# Patient Record
Sex: Male | Born: 1970 | Race: Black or African American | Hispanic: No | Marital: Single | Smoking: Never smoker
Health system: Southern US, Community
[De-identification: ages and names within clinical notes are randomized; demographics above are authoritative.]

## PROBLEM LIST (undated history)

## (undated) DIAGNOSIS — E119 Type 2 diabetes mellitus without complications: Secondary | ICD-10-CM

## (undated) HISTORY — PX: KNEE SURGERY: SHX244

---

## 2010-11-30 ENCOUNTER — Ambulatory Visit: Payer: Self-pay | Admitting: General Practice

## 2012-04-07 IMAGING — CR RIGHT FOOT COMPLETE - 3+ VIEW
1 series · 3 of 3 positions shown · non-contrast
Comparison: none

REASON FOR EXAM: Pain fax report to [phone]
COMMENTS:

PROCEDURE:     DXR - DXR FOOT RT COMPLETE W/OBLIQUES  - November 30, 2010 [DATE]
RESULT:     No fracture, dislocation or other acute bony abnormality is
identified. No periosteal reactive changes are seen. No radiodense soft
tissue foreign body is observed.

[Series 1: view not recorded · 0.17mm/px · 3 of 3 slices shown]
[im 1/3]
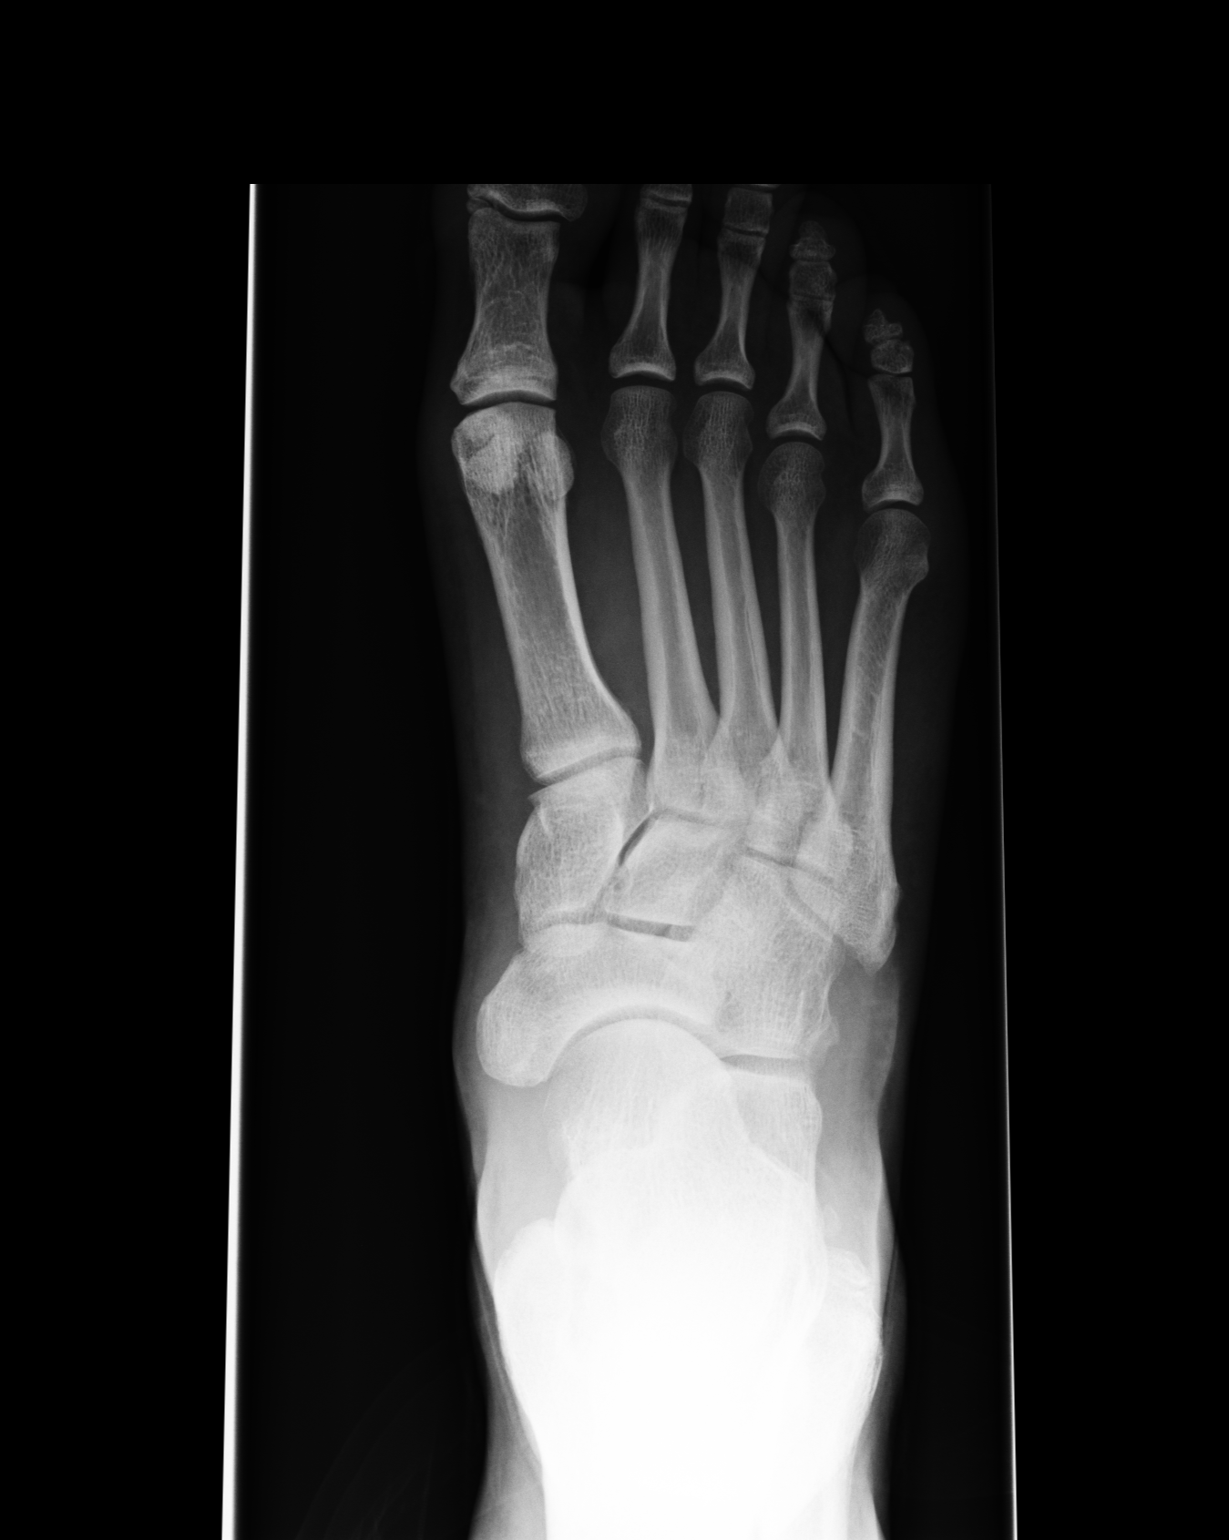
[im 2/3]
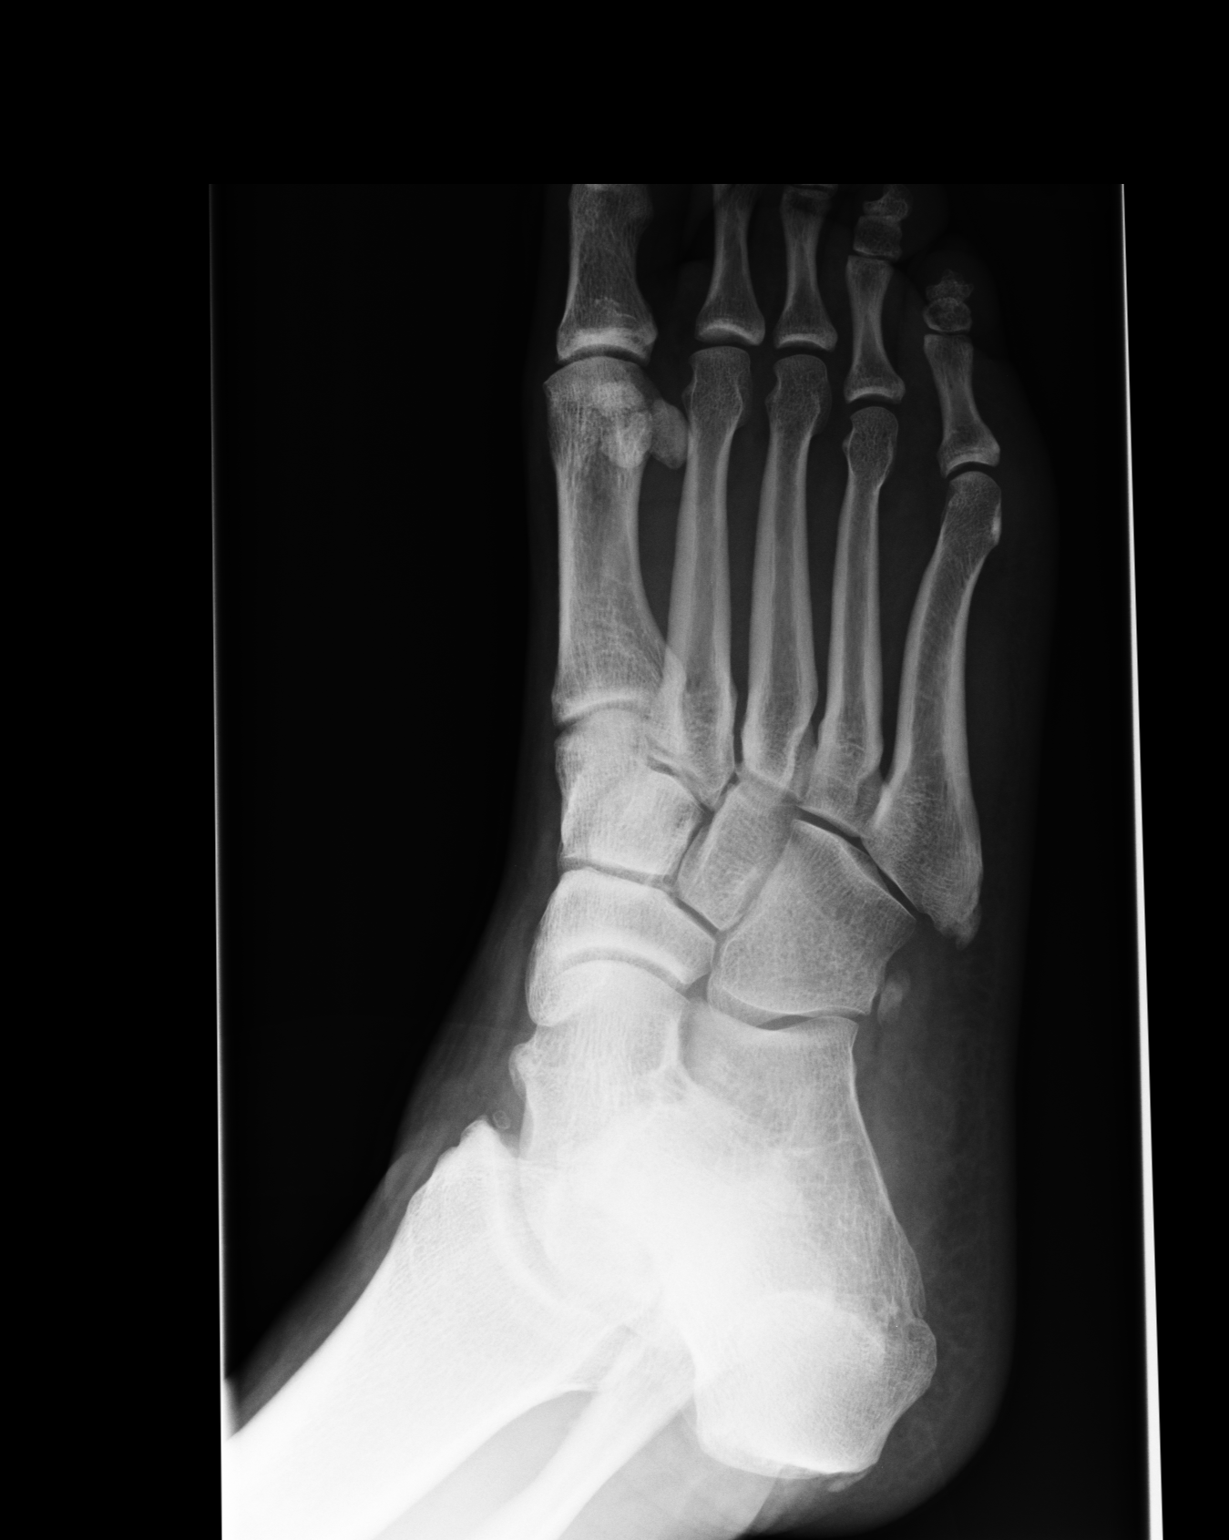
[im 3/3]
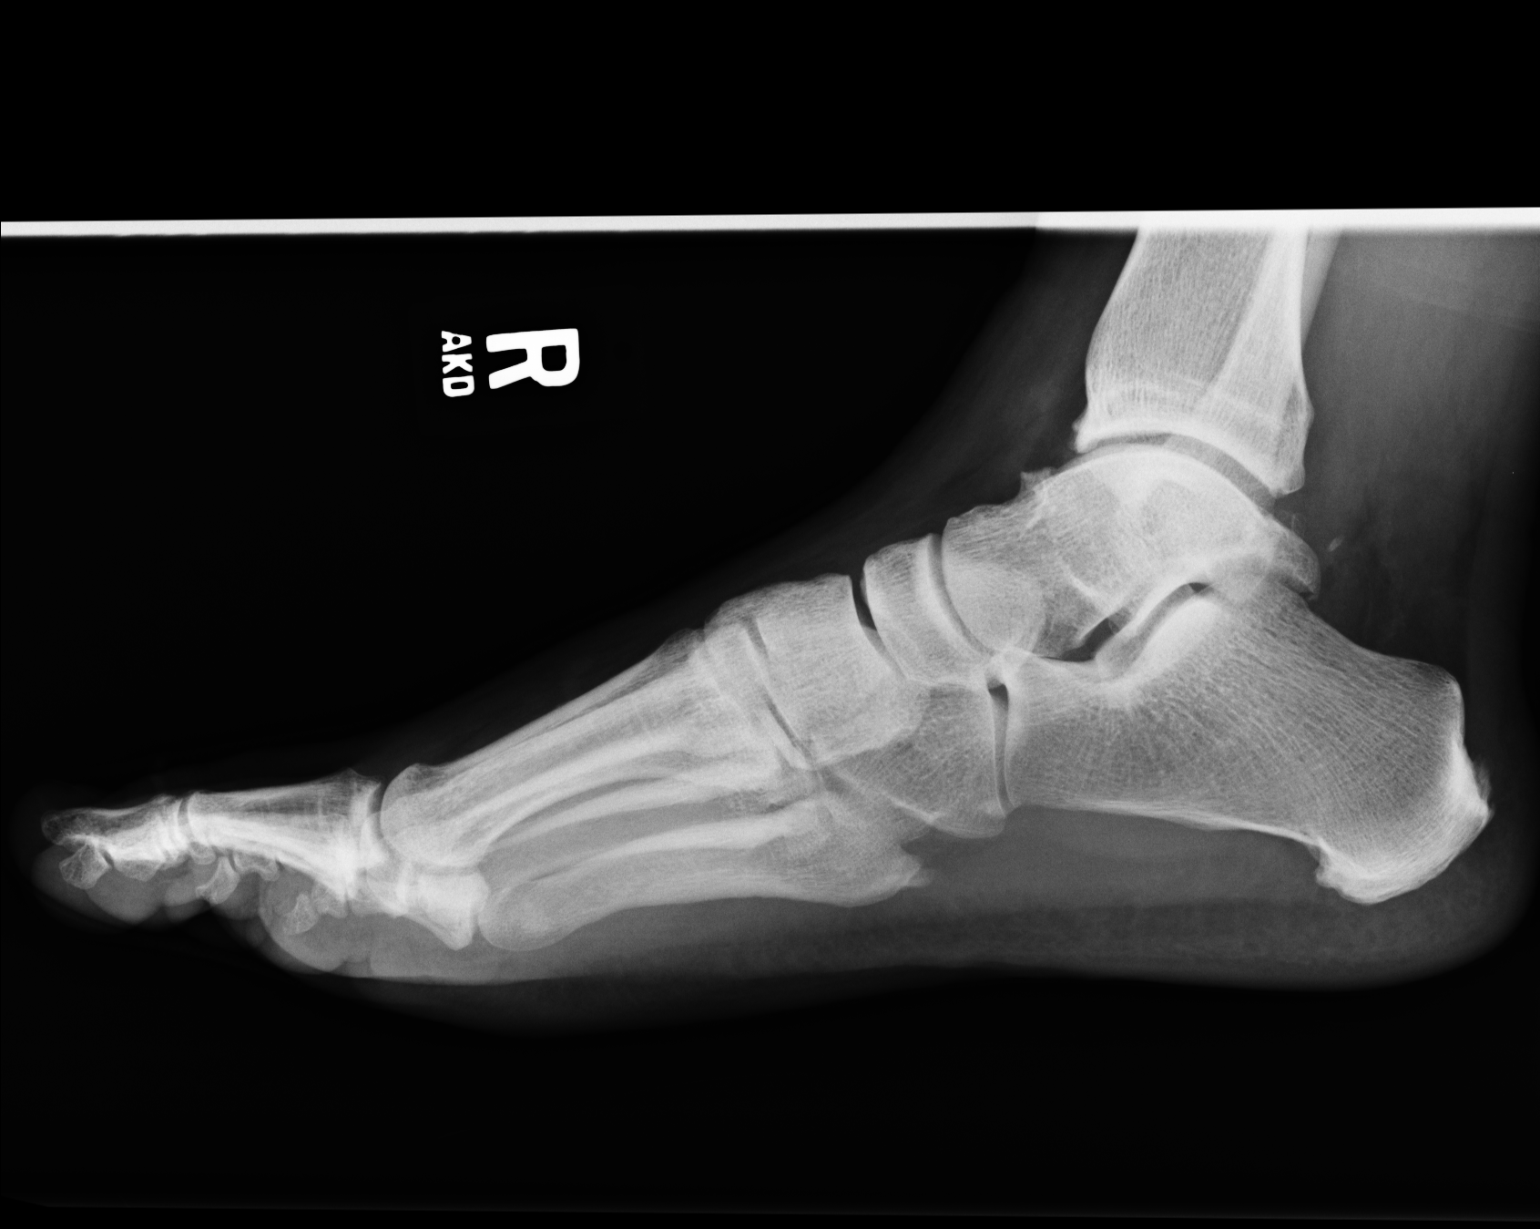

[3 of 3 positions shown; findings below may reference images not displayed]

IMPRESSION: 1.     No significant abnormalities are noted.

## 2013-05-26 ENCOUNTER — Ambulatory Visit: Payer: Self-pay | Admitting: Specialist

## 2013-05-26 LAB — BASIC METABOLIC PANEL
Anion Gap: 6 — ABNORMAL LOW (ref 7–16)
Calcium, Total: 8.7 mg/dL (ref 8.5–10.1)
Chloride: 102 mmol/L (ref 98–107)
EGFR (African American): >60
EGFR (Non-African Amer.): 57 — ABNORMAL LOW
Glucose: 198 mg/dL — ABNORMAL HIGH (ref 65–99)
Osmolality: 278 (ref 275–301)

## 2013-05-26 LAB — SYNOVIAL FLUID, CRYSTAL

## 2013-05-26 LAB — TSH: Thyroid Stimulating Horm: 1.4 u[IU]/mL

## 2013-05-27 LAB — CREATININE, SERUM
Creatinine: 1.31 mg/dL — ABNORMAL HIGH (ref 0.60–1.30)
EGFR (African American): >60
EGFR (Non-African Amer.): >60

## 2013-05-27 LAB — URIC ACID: Uric Acid: 5.3 mg/dL (ref 3.5–7.2)

## 2013-08-05 ENCOUNTER — Ambulatory Visit: Payer: Self-pay | Admitting: Specialist

## 2015-01-07 NOTE — Op Note (Signed)
PATIENT NAME:  Omar Strickland, Omar Strickland MR#:  [id number] DATE OF BIRTH:  02-Feb-1971  DATE OF PROCEDURE:  05/26/2013  PREOPERATIVE DIAGNOSIS: Repeat rupture of the left patellar tendon (16 years post repair).   POSTOPERATIVE DIAGNOSIS: Repeat rupture of the left patellar tendon (16 years post repair).   OPERATION: Open repair of the left patellar tendon and medial and lateral retinaculum.   SURGEON: Park Breed, MD  ANESTHESIA: General endotracheal.   COMPLICATIONS: None.   DRAINS: None.   ESTIMATED BLOOD LOSS: None.   REPLACED: None.   OPERATIVE PROCEDURE: The patient was brought to the operating room, where he underwent satisfactory general endotracheal anesthesia in the supine position. The left leg was prepped and draped in sterile fashion and Esmarch applied. The tourniquet was inflated to 350 mmHg. Tourniquet time was 95 minutes. Previous anterior midline incision was reopened and dissection carried out sharply through the subcutaneous tissue down to the retinaculum and fascia. The medial and lateral full-thickness flaps were created, exposing the quadriceps mechanism, the patella and the patellar tendon region. There was a great deal of blood which was aspirated, and thorough irrigation was done using a pulsating lavage device. The retinaculum was torn back to the midline medially and laterally as well as the patellar tendon torn. Soft tissue debridement was carried out around the patella, end of the patella and the patellar tendon. Joint was examined and was intact with no loose bodies. A portion of the fat pad was excised. The patellar tendon ends were freshened up. Two #5 FiberWire sutures were woven through the patellar tendon. The patella was then drilled with 3 tracts, and a large Beath pin was passed up through the tracts, pulling the FiberWires through them. The medial tract had 2 strands. These were clamped and held in place. The medial and lateral retinaculum were then repaired with  #2 Magnum wire and #2 Orthocord. This provided a very close approximation. With the knee held in full extension, the sutures through the patellar tendon and patella were pulled tight and were both tied snugly over the quadriceps mechanism. This brought the patellar tendon into good apposition with the end of the patella, which had been freshened up with a rongeur and a bur to good bleeding bone. Once this was finished, a drill hole was made behind the tibial tubercle, and a #20 gauge wire was passed through this. It was then brought out in a figure-of-eight fashion and woven through the quadriceps, brought through the quadriceps tendon and back down and tightened anterolaterally as a tension band to protect the repair. Once this was tightened, the knot was buried. Final irrigation was carried out. The subcutaneous tissue was closed with 0 Quill suture, and the skin was closed with staples. The 60 mL of  0.5% Marcaine was placed in the wound and joint. Dry sterile dressing, Polar Care and knee immobilizer were applied. The tourniquet was deflated, with good return of blood flow to the foot. The tourniquet time was 95 minutes. The patient was then awakened and taken to recovery in good condition.   ____________________________ Park Breed, MD hem:OSi D: 05/26/2013 13:24:31 ET T: 05/26/2013 13:40:32 ET JOB#: [id number]  cc: Park Breed, MD, <Dictator> Park Breed MD ELECTRONICALLY SIGNED 05/28/2013 7:47

## 2015-01-07 NOTE — Consult Note (Signed)
Brief Consult Note: Diagnosis: htn.   Patient was seen by consultant.   Consult note dictated.   Recommend further assessment or treatment.   Orders entered.   Discussed with Attending MD.   Comments: 44 yo post op for left knee tendon repair w/t any prir pmh with high bp *high bp: no hx of it but when pressed wife states bp has been on higher side at pcp office. as high as 147/87 range.  perhaps he has underlying htn. start amlodipine. no snoring/waking up at night although he is heavier built/fat neck. weight loss recommended. check tsh *left knee. no pain. monosodium urate. no hx of gout. no pain.  Electronic Signatures for Addendum Section:  Vivien Presto (MD) (Signed Addendum 09-Sep-14 19:50)  blood glucose elevated. will check aic in am.  also with elevated cr.  no previous labs.  needs this monitered. chronic?   Electronic Signatures: Vivien Presto (MD)  (Signed 09-Sep-14 17:15)  Authored: Brief Consult Note   Last Updated: 09-Sep-14 19:50 by Vivien Presto (MD)

## 2015-01-07 NOTE — Op Note (Signed)
PATIENT NAME:  Omar Strickland, Omar Strickland MR#:  [id number] DATE OF BIRTH:  1970/09/29  DATE OF PROCEDURE:  08/05/2013  PREOPERATIVE DIAGNOSIS: Retained painful wire, left knee.   POSTOPERATIVE DIAGNOSIS: Retained painful wire, left knee.  OPERATION: Removal of wire, left knee.   SURGEON: Park Breed, M.D.   ANESTHESIA: General LMA.   COMPLICATIONS: None.   DRAINS: None.   ESTIMATED BLOOD LOSS: None.   REPLACED: None.   DESCRIPTION OF PROCEDURE: The patient was brought to the operating room where he underwent satisfactory general LMA anesthesia in the supine position. The left leg was prepped and draped in sterile fashion. An Esmarch was applied. The tourniquet was inflated to 350 mmHg. Tourniquet time was 17 minutes. An oblique incision was made over the anterolateral aspect of the proximal tibia where the knot in the wire was to be located. Dissection was carried out sharply through subcutaneous tissue and scar. The dissection was then carried out bluntly and the wire knot was identified. This was grasped with a needle holder and the wire was pulled. The entire wire pulled out as 1 piece having already broken. Fluoroscopy showed the wire to be removed. The wound was irrigated and closed with 3-0 Vicryl and staples. Marcaine 0.5% was placed in the wound and a dry sterile dressing was applied. The tourniquet was deflated with good return of blood flow to the foot and the patient was awakened and taken to recovery in good condition.   ____________________________ Park Breed, MD hem:aw D: 08/05/2013 10:59:04 ET T: 08/05/2013 11:08:40 ET JOB#: [id number]  cc: Park Breed, MD, <Dictator> Park Breed MD ELECTRONICALLY SIGNED 08/07/2013 0:44

## 2015-01-07 NOTE — Consult Note (Signed)
PATIENT NAME:  Omar Strickland, Omar Strickland MR#:  [id number] DATE OF BIRTH:  1971/08/20  DATE OF CONSULTATION:  05/26/2013  REFERRING PHYSICIAN:  Earnestine Leys, MD CONSULTING PHYSICIAN:  Vivien Presto, MD  PCP:  Dr. Burt Ek.   REASON FOR CONSULTATION: Hypertension.   HISTORY OF PRESENT ILLNESS: The patient is a pleasant 44 year old male with history of a left patellar tendon repair. 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 He developed knee pain and has been admitted for surgical repair. He underwent patellar tendon rupture repair today. Of note, he has had a similar procedure 16 years ago. Post surgery, he was noted to have high blood pressure. He was given a 10 mg labetalol dose and currently he is in his room. His blood pressures have come down. On further discussion with the family and wife, who was in the room, he denies having any history of hypertension but the wife states that on several occasions the blood pressure at primary care physician's office was noted to be in the high 140s over high 80s. He is on no medications for blood pressure. Hospitalist service was consulted for management and evaluation.   PAST MEDICAL HISTORY: History of left patellar tendon surgery in the past, about 16 years ago.   ALLERGIES: Denies.   SOCIAL HISTORY: No tobacco, alcohol or drug use. Works as reading meters.   FAMILY HISTORY: Both parents have diabetes and hypertension.   OUTPATIENT MEDICATIONS: Vicodin. He is not on any blood pressure medications and the Vicodin is p.r.n.  He is on no other medications.   REVIEW OF SYSTEMS CONSTITUTIONAL: No fever, fatigue, weight changes.  EYES: No blurry vision, double vision or cataracts.  EARS, NOSE, THROAT: No tinnitus, hearing loss or snoring.  RESPIRATORY: No cough, wheezing, shortness of breath, dyspnea on exertion or COPD.   CARDIOVASCULAR: Denies  chest pain. No swelling in the legs. No arrhythmias or palpitations.  GASTROINTESTINAL: No nausea, vomiting, diarrhea, abdominal pain, hematemesis or dark stools.  GENITOURINARY:  Denies dysuria or hematuria.  HEMATOLOGIC AND LYMPHATIC: Denies anemia or easy bruising.  SKIN: No rashes.  MUSCULOSKELETAL: Left-sided knee pain previously, now he is not in pain.  NEURO:  Denies focal weakness, numbness. Had a headache today. No seizures or mini strokes.  PSYCHIATRIC: No anxiety or depression.   PHYSICAL EXAMINATION VITAL SIGNS: Temperature 97.6. Last pulse rate 73, respiratory rate 20, blood pressure 160/98, O2 sat 97% on room air.  GENERAL: The patient is a well-developed, well-built obese African American male lying in bed in no obvious distress, eating supper. HEENT: Normocephalic, atraumatic. Pupils appear to be equal. Extraocular muscles intact. Moist mucous membranes.  NECK: Supple. No thyroid tenderness. No cervical lymphadenopathy. No JVD.  CARDIOVASCULAR: S1, S2, regular rate and rhythm. No murmurs, rubs or gallops.  LUNGS: Clear to auscultation without wheezing, rhonchi or rales.  ABDOMEN: Soft, nontender, nondistended. Positive bowel sounds in all quadrants.  EXTREMITIES: No pitting edema. Left knee is in a soft brace.  NEURO:  Cranial nerves II through XII appear to be grossly intact. Strength is 5 out of 5 all extremities. I did not test strength of the left lower extremity; however, given the recent surgery, but he can wiggle his toes.  SKIN: Shows no obvious rashes.  PSYCHIATRIC: Awake, alert, oriented x 3.   LABORATORY DATA:  No labs thus far. There is a fluid  from synovial in the left knee shows monosodium urate of note.   ASSESSMENT AND PLAN: We have a 44 year old with left patellar tendon repair today with postop hypertension and what appears to be elevated blood pressure at PCP's office previously but he is on no blood pressure medications. He appears to have high blood  pressure and at this point would be started on a blood pressure medication. The choice of blood pressure medications depends on several factors including how his lab slip look like including a creatinine and potassium. A BMP has been ordered. We will also check a TSH. At this point, I will start him on amlodipine but this could easily be changed to an ACE inhibitor or hydrochlorothiazide. He does not appear to be in significant pain as a source of his hypertension. He did receive some fluids. He is on standing fluids as well and now decided to cut that back sooner than later. He is afebrile. The synovial fluid shows monosodium urate of note. He does not have a history of gout. We will follow along with blood pressure.   Thank you for this kind consult.   ____________________________ Vivien Presto, MD sa:cs D: 05/26/2013 17:09:00 ET T: 05/26/2013 18:09:49 ET JOB#: [id number]  cc: Vivien Presto, MD, <Dictator> Nelda Severe. Burt Ek, MD Vivien Presto MD ELECTRONICALLY SIGNED 06/09/2013 13:54

## 2019-12-23 ENCOUNTER — Ambulatory Visit: Payer: Self-pay

## 2019-12-24 ENCOUNTER — Ambulatory Visit: Payer: Self-pay | Attending: Internal Medicine

## 2020-01-11 ENCOUNTER — Encounter: Payer: Self-pay | Admitting: Emergency Medicine

## 2020-01-11 ENCOUNTER — Inpatient Hospital Stay
Admission: EM | Admit: 2020-01-11 | Discharge: 2020-01-14 | DRG: 638 | Disposition: A | Discharge status: Home / Self Care | Payer: Self-pay | Attending: Internal Medicine | Admitting: Internal Medicine

## 2020-01-11 ENCOUNTER — Other Ambulatory Visit: Payer: Self-pay

## 2020-01-11 DIAGNOSIS — E876 Hypokalemia: Secondary | ICD-10-CM | POA: Diagnosis not present

## 2020-01-11 DIAGNOSIS — Z23 Encounter for immunization: Secondary | ICD-10-CM

## 2020-01-11 DIAGNOSIS — Z20822 Contact with and (suspected) exposure to covid-19: Secondary | ICD-10-CM | POA: Diagnosis present

## 2020-01-11 DIAGNOSIS — E871 Hypo-osmolality and hyponatremia: Secondary | ICD-10-CM | POA: Diagnosis present

## 2020-01-11 DIAGNOSIS — R634 Abnormal weight loss: Secondary | ICD-10-CM | POA: Diagnosis present

## 2020-01-11 DIAGNOSIS — R9431 Abnormal electrocardiogram [ECG] [EKG]: Secondary | ICD-10-CM | POA: Diagnosis present

## 2020-01-11 DIAGNOSIS — Z91120 Patient's intentional underdosing of medication regimen due to financial hardship: Secondary | ICD-10-CM

## 2020-01-11 DIAGNOSIS — E875 Hyperkalemia: Secondary | ICD-10-CM | POA: Diagnosis present

## 2020-01-11 DIAGNOSIS — T383X6A Underdosing of insulin and oral hypoglycemic [antidiabetic] drugs, initial encounter: Secondary | ICD-10-CM | POA: Diagnosis present

## 2020-01-11 DIAGNOSIS — E878 Other disorders of electrolyte and fluid balance, not elsewhere classified: Secondary | ICD-10-CM | POA: Diagnosis present

## 2020-01-11 DIAGNOSIS — Z6831 Body mass index (BMI) 31.0-31.9, adult: Secondary | ICD-10-CM

## 2020-01-11 DIAGNOSIS — N179 Acute kidney failure, unspecified: Secondary | ICD-10-CM | POA: Diagnosis present

## 2020-01-11 DIAGNOSIS — D72829 Elevated white blood cell count, unspecified: Secondary | ICD-10-CM | POA: Diagnosis present

## 2020-01-11 DIAGNOSIS — E1110 Type 2 diabetes mellitus with ketoacidosis without coma: Principal | ICD-10-CM | POA: Diagnosis present

## 2020-01-11 HISTORY — DX: Type 2 diabetes mellitus without complications: E11.9

## 2020-01-11 LAB — CBC WITH DIFFERENTIAL/PLATELET
Abs Immature Granulocytes: 0.13 10*3/uL — ABNORMAL HIGH (ref 0.00–0.07)
Basophils Absolute: 0 10*3/uL (ref 0.0–0.1)
Basophils Relative: 0 %
Eosinophils Absolute: 0 10*3/uL (ref 0.0–0.5)
Eosinophils Relative: 0 %
HCT: 50 % (ref 39.0–52.0)
Hemoglobin: 16.8 g/dL (ref 13.0–17.0)
Immature Granulocytes: 1 %
Lymphocytes Relative: 8 %
Lymphs Abs: 0.9 10*3/uL (ref 0.7–4.0)
MCH: 29.8 pg (ref 26.0–34.0)
MCHC: 33.6 g/dL (ref 30.0–36.0)
MCV: 88.8 fL (ref 80.0–100.0)
Monocytes Absolute: 0.4 10*3/uL (ref 0.1–1.0)
Monocytes Relative: 3 %
Neutro Abs: 9.8 10*3/uL — ABNORMAL HIGH (ref 1.7–7.7)
Neutrophils Relative %: 88 %
Platelets: 287 10*3/uL (ref 150–400)
RBC: 5.63 MIL/uL (ref 4.22–5.81)
RDW: 12.6 % (ref 11.5–15.5)
WBC: 11.2 10*3/uL — ABNORMAL HIGH (ref 4.0–10.5)
nRBC: 0 % (ref 0.0–0.2)

## 2020-01-11 LAB — COMPREHENSIVE METABOLIC PANEL
ALT: 36 U/L (ref 0–44)
AST: 31 U/L (ref 15–41)
Albumin: 4.8 g/dL (ref 3.5–5.0)
Alkaline Phosphatase: 134 U/L — ABNORMAL HIGH (ref 38–126)
Anion gap: 19 — ABNORMAL HIGH (ref 5–15)
BUN: 21 mg/dL — ABNORMAL HIGH (ref 6–20)
CO2: 22 mmol/L (ref 22–32)
Calcium: 10.8 mg/dL — ABNORMAL HIGH (ref 8.9–10.3)
Chloride: 76 mmol/L — ABNORMAL LOW (ref 98–111)
Creatinine, Ser: 1.89 mg/dL — ABNORMAL HIGH (ref 0.61–1.24)
GFR calc Af Amer: 48 mL/min — ABNORMAL LOW (ref >60)
GFR calc non Af Amer: 41 mL/min — ABNORMAL LOW (ref >60)
Glucose, Bld: 1156 mg/dL (ref 70–99)
Potassium: 6.1 mmol/L — ABNORMAL HIGH (ref 3.5–5.1)
Sodium: 117 mmol/L — CL (ref 135–145)
Total Bilirubin: 2 mg/dL — ABNORMAL HIGH (ref 0.3–1.2)
Total Protein: 9.4 g/dL — ABNORMAL HIGH (ref 6.5–8.1)

## 2020-01-11 LAB — URINALYSIS, COMPLETE (UACMP) WITH MICROSCOPIC
Bacteria, UA: NONE SEEN
Bilirubin Urine: NEGATIVE
Glucose, UA: >=500 mg/dL — AB
Hgb urine dipstick: NEGATIVE
Ketones, ur: 20 mg/dL — AB
Leukocytes,Ua: NEGATIVE
Nitrite: NEGATIVE
Protein, ur: NEGATIVE mg/dL
Specific Gravity, Urine: 1.026 (ref 1.005–1.030)
Squamous Epithelial / HPF: NONE SEEN (ref 0–5)
pH: 5 (ref 5.0–8.0)

## 2020-01-11 LAB — GLUCOSE, CAPILLARY: Glucose-Capillary: >600 mg/dL (ref 70–99)

## 2020-01-11 MED ORDER — DEXTROSE 50 % IV SOLN: 0.0000 mL | INTRAVENOUS | Status: DC | PRN

## 2020-01-11 MED ORDER — DEXTROSE-NACL 5-0.45 % IV SOLN: INTRAVENOUS | Status: DC

## 2020-01-11 MED ORDER — INSULIN REGULAR(HUMAN) IN NACL 100-0.9 UT/100ML-% IV SOLN
INTRAVENOUS | Status: DC
  Administered 2020-01-11: 13 [IU]/h via INTRAVENOUS
  Administered 2020-01-12: 19:00:00 22 [IU]/h via INTRAVENOUS
  Administered 2020-01-12: 3.2 [IU]/h via INTRAVENOUS
  Filled 2020-01-11 (×3): qty 100

## 2020-01-11 MED ORDER — LACTATED RINGERS IV BOLUS
2000.0000 mL | Freq: Once | INTRAVENOUS | Status: AC
  Administered 2020-01-11: 23:00:00 2000 mL via INTRAVENOUS

## 2020-01-11 MED ORDER — SODIUM CHLORIDE 0.9 % IV BOLUS: 1000.0000 mL | INTRAVENOUS | Status: DC

## 2020-01-11 MED ORDER — SODIUM CHLORIDE 0.9 % IV SOLN: INTRAVENOUS | Status: DC

## 2020-01-11 NOTE — ED Triage Notes (Signed)
Patient ambulatory to triage with steady gait, without difficulty or distress noted, mask in place; st off diabetic meds >year due to finances; meter reading "high" at home; reports frequent urination, blurred vision and wt loss, increased thirst

## 2020-01-11 NOTE — ED Provider Notes (Signed)
[facility] Emergency Department Provider Note  ____________________________________________  Time seen: Approximately 11:30 PM  I have reviewed the triage vital signs and the nursing notes.   HISTORY  Chief Complaint Hyperglycemia   HPI Omar Strickland is a 49 y.o. male the history of diabetes noncompliance with his medications for a year who presents for evaluation of hyperglycemia.  Over the last week patient has had severe constant polyuria, polydipsia, has noted significant weight loss, blurred vision, and fatigue.  His wife this evening decided to check his blood glucose which was too high to read on his monitor and prompted visit to the emergency room.  Patient reports that he has been off of his medications due to inability to afford them.  He denies chest pain or shortness of breath, cough or fever, abdominal pain, nausea, vomiting, diarrhea.   Past Medical History:  Diagnosis Date  . Diabetes mellitus without complication [facility])      Past Surgical History:  Procedure Laterality Date  . KNEE SURGERY Left     Prior to Admission medications   Medication Sig Start Date End Date Taking? Authorizing Provider  ibuprofen (ADVIL) 200 MG tablet Take 200 mg by mouth every 6 (six) hours as needed.   Yes [provider]    Allergies Patient has no known allergies.  No family history on file.  Social History Social History   Tobacco Use  . Smoking status: Never Smoker  . Smokeless tobacco: Never Used  Substance Use Topics  . Alcohol use: Not on file  . Drug use: Not on file    Review of Systems  Constitutional: Negative for fever. Eyes: + blurry vision ENT: Negative for sore throat. Neck: No neck pain  Cardiovascular: Negative for chest pain. Respiratory: Negative for shortness of breath. Endo: + polyuria and polydipsia, unintentional weight loss Gastrointestinal: Negative for abdominal pain, vomiting or  diarrhea. Genitourinary: Negative for dysuria. Musculoskeletal: Negative for back pain. Skin: Negative for rash. Neurological: Negative for headaches, weakness or numbness. Psych: No SI or HI  ____________________________________________   PHYSICAL EXAM:  VITAL SIGNS: ED Triage Vitals  Enc Vitals Group     BP 01/11/20 2123 (!) 172/100     Pulse Rate 01/11/20 2123 80     Resp 01/11/20 2123 18     Temp 01/11/20 2123 98.2 F (36.8 C)     Temp Source 01/11/20 2123 Oral     SpO2 01/11/20 2123 99 %     Weight 01/11/20 2124 219 lb (99.3 kg)     Height 01/11/20 2124 5\' 11"  (1.803 m)     Head Circumference --      Peak Flow --      Pain Score 01/11/20 2124 0     Pain Loc --      Pain Edu? --      Excl. in [place]? --     Constitutional: Alert and oriented. Well appearing and in no apparent distress. HEENT:      Head: Normocephalic and atraumatic.         Eyes: Conjunctivae are normal. Sclera is non-icteric.       Mouth/Throat: Mucous membranes are moist.       Neck: Supple with no signs of meningismus. Cardiovascular: Regular rate and rhythm. No murmurs, gallops, or rubs. 2+ symmetrical distal pulses are present in all extremities. Respiratory: Normal respiratory effort. Lungs are clear to auscultation bilaterally. No wheezes, crackles, or rhonchi.  Gastrointestinal: Soft, non tender Musculoskeletal: No edema,  cyanosis, or erythema of extremities. Neurologic: Normal speech and language. Face is symmetric. Moving all extremities. No gross focal neurologic deficits are appreciated. Skin: Skin is warm, dry and intact. No rash noted. Psychiatric: Mood and affect are normal. Speech and behavior are normal.  ____________________________________________   LABS (all labs ordered are listed, but only abnormal results are displayed)  Labs Reviewed  CBC WITH DIFFERENTIAL/PLATELET - Abnormal; Notable for the following components:      Result Value   WBC 11.2 (*)    Neutro Abs 9.8 (*)     Abs Immature Granulocytes 0.13 (*)    All other components within normal limits  COMPREHENSIVE METABOLIC PANEL - Abnormal; Notable for the following components:   Sodium 117 (*)    Potassium 6.1 (*)    Chloride 76 (*)    Glucose, Bld 1,156 (*)    BUN 21 (*)    Creatinine, Ser 1.89 (*)    Calcium 10.8 (*)    Total Protein 9.4 (*)    Alkaline Phosphatase 134 (*)    Total Bilirubin 2.0 (*)    GFR calc non Af Amer 41 (*)    GFR calc Af Amer 48 (*)    Anion gap 19 (*)    All other components within normal limits  URINALYSIS, COMPLETE (UACMP) WITH MICROSCOPIC - Abnormal; Notable for the following components:   Color, Urine COLORLESS (*)    APPearance CLEAR (*)    Glucose, UA >=500 (*)    Ketones, ur 20 (*)    All other components within normal limits  GLUCOSE, CAPILLARY - Abnormal; Notable for the following components:   Glucose-Capillary >600 (*)    All other components within normal limits  BLOOD GAS, VENOUS - Abnormal; Notable for the following components:   pO2, Ven <31.0 (*)    Acid-base deficit 2.4 (*)    All other components within normal limits  GLUCOSE, CAPILLARY - Abnormal; Notable for the following components:   Glucose-Capillary >600 (*)    All other components within normal limits  RESPIRATORY PANEL BY RT PCR (FLU A&B, COVID)  OSMOLALITY  BETA-HYDROXYBUTYRIC ACID  CBG MONITORING, ED  CBG MONITORING, ED   ____________________________________________  EKG  ED ECG REPORT I, Rudene Re, the attending physician, personally viewed and interpreted this ECG.  Normal sinus rhythm, rate of 88 prolonged QTC of 517, left axis deviation, inferior Q waves and no ST elevations or depressions.  No prior for comparison. ____________________________________________  RADIOLOGY  none  ____________________________________________   PROCEDURES  Procedure(s) performed:yes .1-3 Lead EKG Interpretation Performed by: Rudene Re, MD Authorized by: Rudene Re, MD     Interpretation: normal     ECG rate assessment: normal     Rhythm: sinus rhythm     Ectopy: none     Conduction: normal     Critical Care performed: yes  CRITICAL CARE Performed by: Rudene Re  ?  Total critical care time: 35 min  Critical care time was exclusive of separately billable procedures and treating other patients.  Critical care was necessary to treat or prevent imminent or life-threatening deterioration.  Critical care was time spent personally by me on the following activities: development of treatment plan with patient and/or surrogate as well as nursing, discussions with consultants, evaluation of patient's response to treatment, examination of patient, obtaining history from patient or surrogate, ordering and performing treatments and interventions, ordering and review of laboratory studies, ordering and review of radiographic studies, pulse oximetry and re-evaluation of patient's  condition.  ____________________________________________   INITIAL IMPRESSION / ASSESSMENT AND PLAN / ED COURSE  49 y.o. male with history of diabetes noncompliance with his medications for a year who presents for evaluation of hyperglycemia.  Patient is well-appearing in no distress with normal vital signs, exam is nonfocal.  Labs consistent with DKA with a sugar of 1156, sodium of 117, potassium of 6.1, anion gap of 19. VBG with pH 7.26.   Patient started on 2L bolus of LR.  Insulin drip ordered.  Review of old medical records done.  History gathered from patient and his wife was at bedside.  Discussed care with patient and his wife.  Will consult hospitalist for admission.  _________________________ 12:36 AM on 01/12/2020 -----------------------------------------  Spoke with Dr. Gloriann Loan from the hospitalist service recommended ICU admission.  Discussed with Gavin Pound, ICU APP who accepted patient.        _____________________________________________ Please note:  Patient was evaluated in Emergency Department today for the symptoms described in the history of present illness. Patient was evaluated in the context of the global COVID-19 pandemic, which necessitated consideration that the patient might be at risk for infection with the SARS-CoV-2 virus that causes COVID-19. Institutional protocols and algorithms that pertain to the evaluation of patients at risk for COVID-19 are in a state of rapid change based on information released by regulatory bodies including the CDC and federal and state organizations. These policies and algorithms were followed during the patient's care in the ED.  Some ED evaluations and interventions may be delayed as a result of limited staffing during the pandemic.   [place] Controlled Substance Database was reviewed by me. ____________________________________________   FINAL CLINICAL IMPRESSION(S) / ED DIAGNOSES   Final diagnoses:  Diabetic ketoacidosis without coma associated with type 2 diabetes mellitus ([place])  Hyperkalemia      NEW MEDICATIONS STARTED DURING THIS VISIT:  ED Discharge Orders    None       Note:  This document was prepared using Dragon voice recognition software and may include unintentional dictation errors.    Alfred Levins, [place], MD 01/12/20 [phone]

## 2020-01-12 ENCOUNTER — Other Ambulatory Visit: Payer: Self-pay

## 2020-01-12 DIAGNOSIS — E0810 Diabetes mellitus due to underlying condition with ketoacidosis without coma: Secondary | ICD-10-CM

## 2020-01-12 DIAGNOSIS — E1110 Type 2 diabetes mellitus with ketoacidosis without coma: Secondary | ICD-10-CM | POA: Diagnosis present

## 2020-01-12 LAB — GLUCOSE, CAPILLARY
Glucose-Capillary: 126 mg/dL — ABNORMAL HIGH (ref 70–99)
Glucose-Capillary: 139 mg/dL — ABNORMAL HIGH (ref 70–99)
Glucose-Capillary: 159 mg/dL — ABNORMAL HIGH (ref 70–99)
Glucose-Capillary: 200 mg/dL — ABNORMAL HIGH (ref 70–99)
Glucose-Capillary: 216 mg/dL — ABNORMAL HIGH (ref 70–99)
Glucose-Capillary: 224 mg/dL — ABNORMAL HIGH (ref 70–99)
Glucose-Capillary: 224 mg/dL — ABNORMAL HIGH (ref 70–99)
Glucose-Capillary: 251 mg/dL — ABNORMAL HIGH (ref 70–99)
Glucose-Capillary: 255 mg/dL — ABNORMAL HIGH (ref 70–99)
Glucose-Capillary: 262 mg/dL — ABNORMAL HIGH (ref 70–99)
Glucose-Capillary: 273 mg/dL — ABNORMAL HIGH (ref 70–99)
Glucose-Capillary: 277 mg/dL — ABNORMAL HIGH (ref 70–99)
Glucose-Capillary: 293 mg/dL — ABNORMAL HIGH (ref 70–99)
Glucose-Capillary: 300 mg/dL — ABNORMAL HIGH (ref 70–99)
Glucose-Capillary: 311 mg/dL — ABNORMAL HIGH (ref 70–99)
Glucose-Capillary: 330 mg/dL — ABNORMAL HIGH (ref 70–99)
Glucose-Capillary: 331 mg/dL — ABNORMAL HIGH (ref 70–99)
Glucose-Capillary: 366 mg/dL — ABNORMAL HIGH (ref 70–99)
Glucose-Capillary: 378 mg/dL — ABNORMAL HIGH (ref 70–99)
Glucose-Capillary: 405 mg/dL — ABNORMAL HIGH (ref 70–99)
Glucose-Capillary: 413 mg/dL — ABNORMAL HIGH (ref 70–99)
Glucose-Capillary: 420 mg/dL — ABNORMAL HIGH (ref 70–99)
Glucose-Capillary: 459 mg/dL — ABNORMAL HIGH (ref 70–99)
Glucose-Capillary: 469 mg/dL — ABNORMAL HIGH (ref 70–99)
Glucose-Capillary: 579 mg/dL (ref 70–99)
Glucose-Capillary: >600 mg/dL (ref 70–99)
Glucose-Capillary: >600 mg/dL (ref 70–99)
Glucose-Capillary: >600 mg/dL (ref 70–99)

## 2020-01-12 LAB — PHOSPHORUS: Phosphorus: 3.4 mg/dL (ref 2.5–4.6)

## 2020-01-12 LAB — BASIC METABOLIC PANEL
Anion gap: 17 — ABNORMAL HIGH (ref 5–15)
Anion gap: 12 (ref 5–15)
Anion gap: 11 (ref 5–15)
Anion gap: 10 (ref 5–15)
Anion gap: 11 (ref 5–15)
BUN: 21 mg/dL — ABNORMAL HIGH (ref 6–20)
BUN: 19 mg/dL (ref 6–20)
BUN: 21 mg/dL — ABNORMAL HIGH (ref 6–20)
BUN: 22 mg/dL — ABNORMAL HIGH (ref 6–20)
BUN: 25 mg/dL — ABNORMAL HIGH (ref 6–20)
CO2: 25 mmol/L (ref 22–32)
CO2: 28 mmol/L (ref 22–32)
CO2: 29 mmol/L (ref 22–32)
CO2: 29 mmol/L (ref 22–32)
CO2: 28 mmol/L (ref 22–32)
Calcium: 10.7 mg/dL — ABNORMAL HIGH (ref 8.9–10.3)
Calcium: 10.4 mg/dL — ABNORMAL HIGH (ref 8.9–10.3)
Calcium: 9.7 mg/dL (ref 8.9–10.3)
Calcium: 9.5 mg/dL (ref 8.9–10.3)
Calcium: 9.2 mg/dL (ref 8.9–10.3)
Chloride: 84 mmol/L — ABNORMAL LOW (ref 98–111)
Chloride: 93 mmol/L — ABNORMAL LOW (ref 98–111)
Chloride: 91 mmol/L — ABNORMAL LOW (ref 98–111)
Chloride: 90 mmol/L — ABNORMAL LOW (ref 98–111)
Chloride: 91 mmol/L — ABNORMAL LOW (ref 98–111)
Creatinine, Ser: 1.86 mg/dL — ABNORMAL HIGH (ref 0.61–1.24)
Creatinine, Ser: 1.3 mg/dL — ABNORMAL HIGH (ref 0.61–1.24)
Creatinine, Ser: 1.49 mg/dL — ABNORMAL HIGH (ref 0.61–1.24)
Creatinine, Ser: 1.67 mg/dL — ABNORMAL HIGH (ref 0.61–1.24)
Creatinine, Ser: 1.27 mg/dL — ABNORMAL HIGH (ref 0.61–1.24)
GFR calc Af Amer: 48 mL/min — ABNORMAL LOW (ref >60)
GFR calc Af Amer: >60 mL/min (ref >60)
GFR calc Af Amer: >60 mL/min (ref >60)
GFR calc Af Amer: 55 mL/min — ABNORMAL LOW (ref >60)
GFR calc Af Amer: >60 mL/min (ref >60)
GFR calc non Af Amer: 42 mL/min — ABNORMAL LOW (ref >60)
GFR calc non Af Amer: >60 mL/min (ref >60)
GFR calc non Af Amer: 55 mL/min — ABNORMAL LOW (ref >60)
GFR calc non Af Amer: 48 mL/min — ABNORMAL LOW (ref >60)
GFR calc non Af Amer: >60 mL/min (ref >60)
Glucose, Bld: 737 mg/dL (ref 70–99)
Glucose, Bld: 350 mg/dL — ABNORMAL HIGH (ref 70–99)
Glucose, Bld: 387 mg/dL — ABNORMAL HIGH (ref 70–99)
Glucose, Bld: 282 mg/dL — ABNORMAL HIGH (ref 70–99)
Glucose, Bld: 216 mg/dL — ABNORMAL HIGH (ref 70–99)
Potassium: 4.3 mmol/L (ref 3.5–5.1)
Potassium: 3.4 mmol/L — ABNORMAL LOW (ref 3.5–5.1)
Potassium: 4.2 mmol/L (ref 3.5–5.1)
Potassium: 4.1 mmol/L (ref 3.5–5.1)
Potassium: 3.2 mmol/L — ABNORMAL LOW (ref 3.5–5.1)
Sodium: 126 mmol/L — ABNORMAL LOW (ref 135–145)
Sodium: 133 mmol/L — ABNORMAL LOW (ref 135–145)
Sodium: 131 mmol/L — ABNORMAL LOW (ref 135–145)
Sodium: 129 mmol/L — ABNORMAL LOW (ref 135–145)
Sodium: 130 mmol/L — ABNORMAL LOW (ref 135–145)

## 2020-01-12 LAB — MAGNESIUM
Magnesium: 2.5 mg/dL — ABNORMAL HIGH (ref 1.7–2.4)
Magnesium: 2.1 mg/dL (ref 1.7–2.4)

## 2020-01-12 LAB — BLOOD GAS, VENOUS
Acid-base deficit: 2.4 mmol/L — ABNORMAL HIGH (ref 0.0–2.0)
Bicarbonate: 25.6 mmol/L (ref 20.0–28.0)
O2 Saturation: 39.5 %
Patient temperature: 37
pCO2, Ven: 57 mmHg (ref 44.0–60.0)
pH, Ven: 7.26 (ref 7.250–7.430)
pO2, Ven: <31 mmHg — CL (ref 32.0–45.0)

## 2020-01-12 LAB — HIV ANTIBODY (ROUTINE TESTING W REFLEX): HIV Screen 4th Generation wRfx: NONREACTIVE

## 2020-01-12 LAB — RESPIRATORY PANEL BY RT PCR (FLU A&B, COVID)
Influenza A by PCR: NEGATIVE
Influenza B by PCR: NEGATIVE
SARS Coronavirus 2 by RT PCR: NEGATIVE

## 2020-01-12 LAB — OSMOLALITY: Osmolality: 322 mOsm/kg (ref 275–295)

## 2020-01-12 LAB — BETA-HYDROXYBUTYRIC ACID: Beta-Hydroxybutyric Acid: 2.51 mmol/L — ABNORMAL HIGH (ref 0.05–0.27)

## 2020-01-12 LAB — MRSA PCR SCREENING: MRSA by PCR: NEGATIVE

## 2020-01-12 MED ORDER — INSULIN ASPART 100 UNIT/ML ~~LOC~~ SOLN
0.0000 [IU] | SUBCUTANEOUS | Status: DC
  Administered 2020-01-12: 2 [IU] via SUBCUTANEOUS
  Administered 2020-01-13: 08:00:00 5 [IU] via SUBCUTANEOUS
  Administered 2020-01-13: 04:00:00 8 [IU] via SUBCUTANEOUS
  Filled 2020-01-12 (×3): qty 1

## 2020-01-12 MED ORDER — POLYETHYLENE GLYCOL 3350 17 G PO PACK: 17.0000 g | PACK | Freq: Every day | ORAL | Status: DC | PRN

## 2020-01-12 MED ORDER — ENOXAPARIN SODIUM 40 MG/0.4ML ~~LOC~~ SOLN
40.0000 mg | SUBCUTANEOUS | Status: DC
  Administered 2020-01-12 – 2020-01-13 (×2): 40 mg via SUBCUTANEOUS
  Filled 2020-01-12 (×2): qty 0.4

## 2020-01-12 MED ORDER — ENOXAPARIN SODIUM 40 MG/0.4ML ~~LOC~~ SOLN
40.0000 mg | SUBCUTANEOUS | Status: DC
  Administered 2020-01-12: 40 mg via SUBCUTANEOUS
  Filled 2020-01-12: qty 0.4

## 2020-01-12 MED ORDER — DOCUSATE SODIUM 100 MG PO CAPS: 100.0000 mg | ORAL_CAPSULE | Freq: Two times a day (BID) | ORAL | Status: DC | PRN

## 2020-01-12 MED ORDER — BLISTEX MEDICATED EX OINT
TOPICAL_OINTMENT | CUTANEOUS | Status: DC | PRN
  Filled 2020-01-12: qty 6.3

## 2020-01-12 MED ORDER — ACETAMINOPHEN 325 MG PO TABS
650.0000 mg | ORAL_TABLET | ORAL | Status: DC | PRN
  Administered 2020-01-12 (×2): 650 mg via ORAL
  Filled 2020-01-12 (×2): qty 2

## 2020-01-12 MED ORDER — CHLORHEXIDINE GLUCONATE CLOTH 2 % EX PADS: 6.0000 | MEDICATED_PAD | Freq: Every day | CUTANEOUS | Status: DC

## 2020-01-12 MED ORDER — PNEUMOCOCCAL VAC POLYVALENT 25 MCG/0.5ML IJ INJ
0.5000 mL | INJECTION | INTRAMUSCULAR | Status: AC
  Administered 2020-01-13: 09:00:00 0.5 mL via INTRAMUSCULAR
  Filled 2020-01-12: qty 0.5

## 2020-01-12 MED ORDER — MORPHINE SULFATE (PF) 2 MG/ML IV SOLN
2.0000 mg | Freq: Once | INTRAVENOUS | Status: AC
  Administered 2020-01-12: 06:00:00 2 mg via INTRAVENOUS
  Filled 2020-01-12 (×2): qty 1

## 2020-01-12 MED ORDER — POTASSIUM CHLORIDE CRYS ER 20 MEQ PO TBCR
40.0000 meq | EXTENDED_RELEASE_TABLET | ORAL | Status: AC
  Administered 2020-01-12 (×2): 40 meq via ORAL
  Filled 2020-01-12 (×2): qty 2

## 2020-01-12 MED ORDER — INSULIN STARTER KIT- PEN NEEDLES (ENGLISH)
1.0000 | Freq: Once | Status: DC
  Filled 2020-01-12 (×2): qty 1

## 2020-01-12 MED ORDER — DEXTROSE IN LACTATED RINGERS 5 % IV SOLN: INTRAVENOUS | Status: DC

## 2020-01-12 MED ORDER — INSULIN DETEMIR 100 UNIT/ML ~~LOC~~ SOLN
40.0000 [IU] | SUBCUTANEOUS | Status: DC
  Administered 2020-01-12: 22:00:00 40 [IU] via SUBCUTANEOUS
  Filled 2020-01-12 (×2): qty 0.4

## 2020-01-12 MED ORDER — LIVING WELL WITH DIABETES BOOK
Freq: Once | Status: AC
  Filled 2020-01-12: qty 1

## 2020-01-12 MED ORDER — POTASSIUM CHLORIDE CRYS ER 20 MEQ PO TBCR
20.0000 meq | EXTENDED_RELEASE_TABLET | ORAL | Status: DC
  Administered 2020-01-12: 08:00:00 20 meq via ORAL
  Filled 2020-01-12: qty 1

## 2020-01-12 MED ORDER — ONDANSETRON HCL 4 MG/2ML IJ SOLN: 4.0000 mg | Freq: Four times a day (QID) | INTRAMUSCULAR | Status: DC | PRN

## 2020-01-12 NOTE — ED Notes (Signed)
Date and time results received: 01/12/20 12:40 AM  Test: pO2 Critical Value: 27  Name of Provider Notified: Alfred Levins

## 2020-01-12 NOTE — Care Management (Signed)
This is a no charge note  Pick up from PCCM, Dr,. 77  49 year old man with past medical history of diabetes, who is admitted to ICU due to DKA secondary to noncompliance.  Patient also had AKI.  Patient was started insulin drip.  Anion gap is closed.  Ivor Costa, MD  [facility]   If 7PM-7AM, please contact night-coverage [email or web address] 01/12/2020, 2:35 PM

## 2020-01-12 NOTE — H&P (Signed)
Name: Omar Strickland MRN: [id number] DOB: 03-01-1971    ADMISSION DATE:  01/11/2020 CONSULTATION DATE: 01/12/2020  REFERRING MD : Dr. Alfred Levins   CHIEF COMPLAINT: Hyperglycemia   BRIEF PATIENT DESCRIPTION:  49 yo male admitted with DKA secondary to medication noncompliance requiring insulin gtt   SIGNIFICANT EVENTS/STUDIES:  04/27: Pt admitted to the stepdown unit per PCCM team with DKA   HISTORY OF PRESENT ILLNESS:   This is a 49 yo male with a PMH of Type II Diabetes Mellitus.  He presented to [facility] ER on 04/27 with c/o hyperglycemia, weight loss (estimated 40 lbs), blurred vision, polyuria/polydipsia, and fatigue.  Per ER notes pts wife checked his blood glucose the evening of 04/26 with a reading of "high" prompting ER visit.  Pt reported he has noted taken his diabetes medication over the past year due to inability to afford his medications.  ER EKG revealed normal sinus rhythm with prolonged QTc of 517 but not ST elevation or depression.  Lab results ruled pt in for DKA, therefore he received 2L LR bolus and insulin gtt initiated.  PCCM team contacted by ER for hospital admission.    PAST MEDICAL HISTORY :   has a past medical history of Diabetes mellitus without complication ([place]).  has a past surgical history that includes Knee surgery (Left). Prior to Admission medications   Medication Sig Start Date End Date Taking? Authorizing Provider  ibuprofen (ADVIL) 200 MG tablet Take 200 mg by mouth every 6 (six) hours as needed.   Yes [provider]   No Known Allergies  FAMILY HISTORY:  family history is not on file. SOCIAL HISTORY:  reports that he has never smoked. He has never used smokeless tobacco.  REVIEW OF SYSTEMS: Positives in BOLD  Constitutional: fever, chills, weight loss, malaise/fatigue, hyperglycemia, and diaphoresis.  HENT: Negative for hearing loss, ear pain, nosebleeds, congestion, sore throat, neck pain, tinnitus and ear discharge.   Eyes: blurred  vision, double vision, photophobia, pain, discharge and redness.  Respiratory: Negative for cough, hemoptysis, sputum production, shortness of breath, wheezing and stridor.   Cardiovascular: Negative for chest pain, palpitations, orthopnea, claudication, leg swelling and PND.  Gastrointestinal: Negative for heartburn, nausea, vomiting, abdominal pain, diarrhea, constipation, blood in stool and melena.  Genitourinary: polyuria/polydipsia, dysuria, urgency, frequency, hematuria and flank pain.  Musculoskeletal: Negative for myalgias, back pain, joint pain and falls.  Skin: Negative for itching and rash.  Neurological: Negative for dizziness, tingling, tremors, sensory change, speech change, focal weakness, seizures, loss of consciousness, weakness and headaches.  Endo/Heme/Allergies: Negative for environmental allergies and polydipsia. Does not bruise/bleed easily.  SUBJECTIVE:  No complaints at this time  VITAL SIGNS: Temp:  [98.2 F (36.8 C)] 98.2 F (36.8 C) (04/26 2123) Pulse Rate:  [80-96] 96 (04/26 2314) Resp:  [18-20] 20 (04/26 2314) BP: (149-172)/(99-100) 149/99 (04/26 2314) SpO2:  [99 %] 99 % (04/26 2314) Weight:  [99.3 kg] 99.3 kg (04/26 2124)  PHYSICAL EXAMINATION: General: well developed, well nourished male resting in bed  Neuro: alert and oriented, follows commands  HEENT: supple, no JVD  Cardiovascular: nsr, rrr, no R/G Lungs: clear throughout, even, non labored  Abdomen: +BS x4, obese, soft, non tender, non distended  Musculoskeletal: normal bulk and tone, no edema  Skin: intact no rashes or lesions present   Recent Labs  Lab 01/11/20 2148  NA 117*  K 6.1*  CL 76*  CO2 22  BUN 21*  CREATININE 1.89*  GLUCOSE 1,156*   Recent Labs  Lab 01/11/20 2148  HGB 16.8  HCT 50.0  WBC 11.2*  PLT 287   No results found.  ASSESSMENT / PLAN:  Diabetic Ketoacidosis due to medication noncompliance  Acute renal failure and hyponatremia/hyperkalemia/hypochloremia in  setting of DKA  Mild leukocytosis likely reactive no obvious s/sx of infectious process  Continue insulin gtt until anion gap closed and serum CO2 >20  BMP q4hrs while on insulin gtt  CBG's per EndoTool recommendations  IV fluids per DKA protocol  Trend BMP  Replace electrolytes as indicated  Monitor UOP Avoid nephrotoxic medications Continuous telemetry monitoring Trend WBC and monitor fever   Diabetes coordinator consulted appreciate input  Transition of care team consulted for medication assistance  VTE px: subq lovenox  Monitor for s/sx of bleeding and transfuse for hgb <7  Marda Stalker, [place] Pager [phone] (please enter 7 digits) PCCM Consult Pager [phone] (please enter 7 digits)

## 2020-01-12 NOTE — TOC Initial Note (Addendum)
Transition of Care [facility]) - Initial/Assessment Note    Patient Details  Name: Omar Strickland MRN: [id number] Date of Birth: [birth year]  Transition of Care [facility]) CM/SW Contact:    Magnus Ivan, LCSW Phone Number: 01/12/2020, 1:06 PM  Clinical Narrative:                CSW consulted due to patient being uninsured. Per chart review, patient is unable to afford his insulin. CSW met with patient and his wife at bedside. Patient sleeping. Patient's wife confirmed patient is uninsured and reported he has not had a PCP in about a year. CSW provided [employer] Booklet and information for [facility] and [facility]. Patient's wife reported she thinks patient set himself up an appointment with [facility] for this week and plans to use them as his PCP. CSW provided [facility] application just in case per her request. Informed [facility] Representative Eldred of referral.   Patient has a consult order in for a Diabetes Coordinator. CSW will follow-up on their recommendations in trying to find more affordable option for patient's insulin.    3:00- CSW informed by Diabetes Coordinator that patient needs diabetic supplies. CSW provided supplies.   Per Diabetes Coordinator, patient had set himself up with [facility] for PCP services at discharge (not [facility]). Patient plans to reschedule his appointment as it was for tomorrow and he will likely still be at the hospital per Diabetes Coordinator. Diabetes Coordinator is following up about insulin needs and availability at [facility] for patient.   CSW will continue to follow.    Expected Discharge Plan: Home/Self Care Barriers to Discharge: Continued Medical Work up   Patient Goals and CMS Choice        Expected Discharge Plan and Services Expected Discharge Plan: Home/Self Care       Living arrangements for the past 2 months: Single Family Home                                      Prior Living Arrangements/Services Living arrangements for the past 2 months: Single Family Home Lives with:: Spouse Patient language and need for interpreter reviewed:: Yes Do you feel safe going back to the place where you live?: Yes      Need for Family Participation in Patient Care: Yes (Comment) Care giver support system in place?: Yes (comment)   Criminal Activity/Legal Involvement Pertinent to Current Situation/Hospitalization: No - Comment as needed  Activities of Daily Living Home Assistive Devices/Equipment: None ADL Screening (condition at time of admission) Patient's cognitive ability adequate to safely complete daily activities?: No Is the patient deaf or have difficulty hearing?: No Does the patient have difficulty seeing, even when wearing glasses/contacts?: No Does the patient have difficulty concentrating, remembering, or making decisions?: No Patient able to express need for assistance with ADLs?: No Does the patient have difficulty dressing or bathing?: No Independently performs ADLs?: No Communication: Independent Dressing (OT): Independent Grooming: Independent Feeding: Independent Bathing: Independent Toileting: Independent In/Out Bed: Independent Walks in Home: Independent Does the patient have difficulty walking or climbing stairs?: No Weakness of Legs: None Weakness of Arms/Hands: None  Permission Sought/Granted Permission sought to share information with : Facility Sport and exercise psychologist Permission granted to share information with : Yes, Verbal Permission Granted(permission granted by wife. patient asleep)     Permission granted to share info  w AGENCY: [facility], [facility]        Emotional Assessment Appearance:: Appears stated age Attitude/Demeanor/Rapport: Unable to Assess Affect (typically observed): (sleeping)   Alcohol / Substance Use: Not Applicable Psych Involvement: No (comment)  Admission  diagnosis:  Hyperkalemia [E87.5] DKA (diabetic ketoacidoses) ([place]) [E11.10] Diabetic ketoacidosis without coma associated with type 2 diabetes mellitus ([place]) [E11.10] Patient Active Problem List   Diagnosis Date Noted  . DKA (diabetic ketoacidoses) ([place]) 01/12/2020   PCP:  Patient, No Pcp Per Pharmacy:   [id number] [place] [id number]: [phone]: [phone]    Social Determinants of Health (SDOH) Interventions    Readmission Risk Interventions No flowsheet data found.

## 2020-01-12 NOTE — Progress Notes (Signed)
Initial Nutrition Assessment  DOCUMENTATION CODES:   Obesity unspecified  INTERVENTION:  RD reviewed "Carbohydrate Counting for People with Diabetes" handout from the Academy of Nutrition and Dietetics with patient's wife as patient was sleeping. Discussed different food groups and their effects on blood sugar, emphasizing carbohydrate-containing foods. Provided list of carbohydrates and recommended serving sizes of common foods. Discussed importance of controlled and consistent carbohydrate intake throughout the day. Provided examples of ways to balance meals/snacks and encouraged intake of high-fiber, whole grain complex carbohydrates. Teach back method used.  NUTRITION DIAGNOSIS:   Food and nutrition related knowledge deficit related to limited prior education as evidenced by per patient/family report.  GOAL:   Patient will meet greater than or equal to 90% of their needs  MONITOR:   PO intake, Labs, Weight trends, I & O's  REASON FOR ASSESSMENT:   Malnutrition Screening Tool    ASSESSMENT:   49 year old male with PMHx of DM admitted with DKA secondary to medication noncompliance.   Met with patient and wife at bedside. Patient was sleeping so history was provided by wife. Wife reports patient was diagnosed with pre-diabetes approximately 5 years ago. She reports he has not been able to see a doctor for the past year. Patient has had weight loss. His UBW was 258 lbs and he lost down to 219 lbs per their scale at home. Current weight documented in chart is 102.7 kg (226.41 lbs). Unable to determine significance of weight loss without time frame. Unable to complete NFPE as patient is sleeping but there are no overt signs of wasting on areas visible outside of blankets. Wife reports patient was able to eat well at breakfast. She does not think he will need any oral nutrition supplements at this time. Will monitor for PO intake. Provided education to wife.  Medications reviewed and  include: D5W at 75 mL/hr, regular insulin gtt.   Labs reviewed: CBG 224-459, Sodium 131, Chloride 91, BUN 21, Creatinine 1.49.  NUTRITION - FOCUSED PHYSICAL EXAM:  Deferred at this time as patient was sleeping.  Diet Order:   Diet Order            Diet heart healthy/carb modified Room service appropriate? Yes; Fluid consistency: Thin  Diet effective now             EDUCATION NEEDS:   Education needs have been addressed  Skin:  Skin Assessment: Reviewed RN Assessment  Last BM:  01/11/2020  Height:   Ht Readings from Last 1 Encounters:  01/12/20 5' 11"  (1.803 m)   Weight:   Wt Readings from Last 1 Encounters:  01/12/20 102.7 kg   BMI:  Body mass index is 31.58 kg/m.  Estimated Nutritional Needs:   Kcal:  2300-2500  Protein:  115-125 grams  Fluid:  >/= 2 L/day  Jacklynn Barnacle, MS, RD, LDN Pager number available on Amion

## 2020-01-12 NOTE — Progress Notes (Signed)
Pharmacy Electrolyte Monitoring Consult:  Pharmacy consulted to assist in monitoring and replacing electrolytes in this 49 y.o. male admitted on 01/11/2020 with DKA.   Labs:  Sodium (mmol/L)  Date Value  01/12/2020 129 (L)  05/26/2013 135 (L)   Potassium (mmol/L)  Date Value  01/12/2020 4.1  05/26/2013 4.3   Magnesium (mg/dL)  Date Value  01/12/2020 2.1   Phosphorus (mg/dL)  Date Value  01/12/2020 3.4   Calcium (mg/dL)  Date Value  01/12/2020 9.5   Calcium, Total (mg/dL)  Date Value  05/26/2013 8.7   Albumin (g/dL)  Date Value  01/11/2020 4.8    Assessment/Plan: Patient is continuing to require continuous insulin infusion and is receiving D5/LR at 44mL/hr.   No replacement warranted. Will continue to monitor with Q4hr BMPs while patient is on continuous insulin infusion.   Will continue to replace to maintain potassium ~ 4 while on insulin infusion.   Pharmacy will continue to monitor and adjust per consult.   Daniel Johndrow L 01/12/2020 4:16 PM

## 2020-01-12 NOTE — Progress Notes (Signed)
Inpatient Diabetes Program Recommendations  AACE/ADA: New Consensus Statement on Inpatient Glycemic Control   Target Ranges:  Prepandial:   less than 140 mg/dL      Peak postprandial:   less than 180 mg/dL (1-2 hours)      Critically ill patients:  140 - 180 mg/dL  Results for Omar Strickland, Omar Strickland (MRN [id number]) as of 01/12/2020 07:54  Ref. Range 01/12/2020 00:35 01/12/2020 01:09 01/12/2020 01:46 01/12/2020 02:15 01/12/2020 02:43 01/12/2020 03:13 01/12/2020 03:49 01/12/2020 04:59 01/12/2020 06:04 01/12/2020 06:54 01/12/2020 07:17  Glucose-Capillary Latest Ref Range: 70 - 99 mg/dL >600 (HH) >600 (HH) >600 (HH) 579 (HH) 469 (H) 413 (H) 378 (H) 331 (H) 293 (H) 262 (H) 224 (H)   Results for Omar Strickland, Omar Strickland (MRN [id number]) as of 01/12/2020 07:54  Ref. Range 01/11/2020 21:48 01/12/2020 01:06 01/12/2020 05:10  Beta-Hydroxybutyric Acid Latest Ref Range: 0.05 - 0.27 mmol/L  2.51 (H)   Glucose Latest Ref Range: 70 - 99 mg/dL 1,156 (HH) 737 (HH) 350 (H)   Review of Glycemic Control  Diabetes history: DM2 Outpatient Diabetes medications: No DM medications in over 1 year due to inability to afford Current orders for Inpatient glycemic control: IV insulin drip  Inpatient Diabetes Program Recommendations:   Insulin at Transition from IV to SQ: Once MD is ready to transition from IV to SQ insulin, please consider ordering Levemir 40 units Q24H (based on 102.7 kg x 0.4 units), CBGs Q4H, and Novolog 0-15 units Q4H.  HbgA1C: A1C in process.  NOTE: Noted consult for Diabetes Coordinator. Chart reviewed. Per H&P, patient has not taken DM medications in over a year as he was not able to afford. No insurance or PCP and TOC is already consulted. Ordered Living Well with DM book. Initial glucose 1156 mg/dl on 01/11/20 and patient is currently receiving IV insulin.  Spoke with patient and his wife at bedside. Patient is sleepy but easily arousable and engaged in discussion. Patient was dx with Pre-DM about 5 years ago and was  taking Metformin in the past. Patient's PCP took him off Metformin about 1 1/2 years ago and patient has not seen a provider since as he did not have insurance. Patient recently made himself an appointment at [facility] and his appointment is on 01/13/20 at 3:40. Explained that he will likely need to call and reschedule as he may not be out of the hospital by tomorrow. Patient has a old glucometer (49 years old) and old test strips at home that his wife used to check his glucose. Will ask TOC to provide a new glucometer and testing supplies if available. Informed patient that if TOC provides a glucometer it would be a Reli-[employer] glucometer and testing supplies so when he needs more supplies he can purchase at [facility] (box of 50 test strips $9).  Patient would like to use local clinic for follow up and [facility] to get medications. TOC team has already talked with patient and are following along for needs. Patient has Living Well with DM book at bedside. Encouraged patient to read through the book to increase knowledge about DM management and importance of keeping DM controlled.  Patient reports that the last time he had his A1C checked it was 5.9% and that is when his PCP took him off Metformin.  Explained that current A1C is ordered but not resulted at this time. Discussed basic pathophysiology of DM Type 2, basic home care, importance of checking CBGs and maintaining good CBG control to prevent  long-term and short-term complications. Discussed DKA and hospital protocol for treating DKA with IV insulin and transitioning to SQ insulin once acidosis is cleared.  Reviewed glucose and A1C goals.  Reviewed signs and symptoms of hyperglycemia and hypoglycemia along with treatment for both. Discussed impact of nutrition, exercise, stress, sickness, and medications on diabetes control.  Discussed Carb Modified diet and patient reports that he was not following any type of diet and frequently eats  foods high in carbohydrates. Placed consult for RD for further diet education. Informed patient that he will most likely need insulin as an outpatient given initial glucose of 1156 mg/dl and how glucose is trending on IV insulin drip.  Informed patient that an insulin starter kit would be ordered and I would come back tomorrow to discuss insulin further. Informed patient that since he will likely get medications from [facility], I would call and see which insulins they have on hand and whether they have insulin vials or pens. Patient verbalized understanding of information discussed and he states that he has no further questions at this time related to diabetes.   RNs to provide ongoing basic DM education at bedside with this patient and engage patient to actively check blood glucose and administer insulin injections once transitioned off IV insulin.    Thanks, Barnie Alderman, RN, MSN, CDE Diabetes Coordinator Inpatient Diabetes Program [phone] (Team Pager from 8am to 5pm)

## 2020-01-12 NOTE — ED Notes (Signed)
Date and time results received: 01/11/20 2243  Test: Blood Glucose Critical Value: 1156  Name of Provider Notified: [facility]

## 2020-01-12 NOTE — Progress Notes (Signed)
Pharmacy Electrolyte Monitoring Consult:  Pharmacy consulted to assist in monitoring and replacing electrolytes in this 49 y.o. male admitted on 01/11/2020 with DKA.   Labs:  Sodium (mmol/L)  Date Value  01/12/2020 130 (L)  05/26/2013 135 (L)   Potassium (mmol/L)  Date Value  01/12/2020 3.2 (L)  05/26/2013 4.3   Magnesium (mg/dL)  Date Value  01/12/2020 2.1   Phosphorus (mg/dL)  Date Value  01/12/2020 3.4   Calcium (mg/dL)  Date Value  01/12/2020 9.2   Calcium, Total (mg/dL)  Date Value  05/26/2013 8.7   Albumin (g/dL)  Date Value  01/11/2020 4.8    Assessment/Plan: Patient is continuing to require continuous insulin infusion and is receiving D5/LR at 47mL/hr.   No replacement warranted. Will continue to monitor with Q4hr BMPs while patient is on continuous insulin infusion.   Will continue to replace to maintain potassium ~ 4 while on insulin infusion.   Will give potassium 40 mEq PO x 2 doses. Patient can likely transition off of insulin drip as gap is closed and bicarb > 20.  Pharmacy will continue to monitor and adjust per consult.   Tawnya Crook, PharmD 01/12/2020 6:32 PM

## 2020-01-13 LAB — BASIC METABOLIC PANEL
Anion gap: 8 (ref 5–15)
BUN: 25 mg/dL — ABNORMAL HIGH (ref 6–20)
CO2: 28 mmol/L (ref 22–32)
Calcium: 8.6 mg/dL — ABNORMAL LOW (ref 8.9–10.3)
Chloride: 96 mmol/L — ABNORMAL LOW (ref 98–111)
Creatinine, Ser: 1.42 mg/dL — ABNORMAL HIGH (ref 0.61–1.24)
GFR calc Af Amer: >60 mL/min (ref >60)
GFR calc non Af Amer: 58 mL/min — ABNORMAL LOW (ref >60)
Glucose, Bld: 288 mg/dL — ABNORMAL HIGH (ref 70–99)
Potassium: 3.8 mmol/L (ref 3.5–5.1)
Sodium: 132 mmol/L — ABNORMAL LOW (ref 135–145)

## 2020-01-13 LAB — CBC WITH DIFFERENTIAL/PLATELET
Abs Immature Granulocytes: 0.01 10*3/uL (ref 0.00–0.07)
Basophils Absolute: 0 10*3/uL (ref 0.0–0.1)
Basophils Relative: 0 %
Eosinophils Absolute: 0.1 10*3/uL (ref 0.0–0.5)
Eosinophils Relative: 1 %
HCT: 39.6 % (ref 39.0–52.0)
Hemoglobin: 14.2 g/dL (ref 13.0–17.0)
Immature Granulocytes: 0 %
Lymphocytes Relative: 47 %
Lymphs Abs: 3.8 10*3/uL (ref 0.7–4.0)
MCH: 30.1 pg (ref 26.0–34.0)
MCHC: 35.9 g/dL (ref 30.0–36.0)
MCV: 83.9 fL (ref 80.0–100.0)
Monocytes Absolute: 0.5 10*3/uL (ref 0.1–1.0)
Monocytes Relative: 6 %
Neutro Abs: 3.8 10*3/uL (ref 1.7–7.7)
Neutrophils Relative %: 46 %
Platelets: 188 10*3/uL (ref 150–400)
RBC: 4.72 MIL/uL (ref 4.22–5.81)
RDW: 12.1 % (ref 11.5–15.5)
WBC: 8.2 10*3/uL (ref 4.0–10.5)
nRBC: 0 % (ref 0.0–0.2)

## 2020-01-13 LAB — HEMOGLOBIN A1C
Hgb A1c MFr Bld: 13.9 % — ABNORMAL HIGH (ref 4.8–5.6)
Hgb A1c MFr Bld: 13.4 % — ABNORMAL HIGH (ref 4.8–5.6)
Mean Plasma Glucose: 352 mg/dL
Mean Plasma Glucose: 337.88 mg/dL

## 2020-01-13 LAB — MAGNESIUM: Magnesium: 2.3 mg/dL (ref 1.7–2.4)

## 2020-01-13 LAB — GLUCOSE, CAPILLARY
Glucose-Capillary: 149 mg/dL — ABNORMAL HIGH (ref 70–99)
Glucose-Capillary: 211 mg/dL — ABNORMAL HIGH (ref 70–99)
Glucose-Capillary: 242 mg/dL — ABNORMAL HIGH (ref 70–99)
Glucose-Capillary: 258 mg/dL — ABNORMAL HIGH (ref 70–99)
Glucose-Capillary: 291 mg/dL — ABNORMAL HIGH (ref 70–99)
Glucose-Capillary: 318 mg/dL — ABNORMAL HIGH (ref 70–99)
Glucose-Capillary: 422 mg/dL — ABNORMAL HIGH (ref 70–99)

## 2020-01-13 LAB — PHOSPHORUS: Phosphorus: 1.8 mg/dL — ABNORMAL LOW (ref 2.5–4.6)

## 2020-01-13 MED ORDER — INSULIN ASPART 100 UNIT/ML ~~LOC~~ SOLN
0.0000 [IU] | Freq: Every day | SUBCUTANEOUS | Status: DC
  Administered 2020-01-13: 23:00:00 2 [IU] via SUBCUTANEOUS
  Filled 2020-01-13: qty 1

## 2020-01-13 MED ORDER — INSULIN ASPART 100 UNIT/ML ~~LOC~~ SOLN
7.0000 [IU] | Freq: Three times a day (TID) | SUBCUTANEOUS | Status: DC
  Administered 2020-01-13 – 2020-01-14 (×4): 7 [IU] via SUBCUTANEOUS
  Filled 2020-01-13 (×4): qty 1

## 2020-01-13 MED ORDER — INSULIN ASPART 100 UNIT/ML ~~LOC~~ SOLN
0.0000 [IU] | Freq: Three times a day (TID) | SUBCUTANEOUS | Status: DC
  Administered 2020-01-13: 17:00:00 11 [IU] via SUBCUTANEOUS
  Administered 2020-01-13: 12:00:00 15 [IU] via SUBCUTANEOUS
  Administered 2020-01-14: 09:00:00 3 [IU] via SUBCUTANEOUS
  Administered 2020-01-14: 13:00:00 8 [IU] via SUBCUTANEOUS
  Filled 2020-01-13 (×4): qty 1

## 2020-01-13 MED ORDER — SODIUM PHOSPHATES 45 MMOLE/15ML IV SOLN
20.0000 mmol | Freq: Once | INTRAVENOUS | Status: AC
  Administered 2020-01-13: 20 mmol via INTRAVENOUS
  Filled 2020-01-13: qty 6.67

## 2020-01-13 MED ORDER — ENOXAPARIN SODIUM 40 MG/0.4ML ~~LOC~~ SOLN
40.0000 mg | SUBCUTANEOUS | Status: DC
  Administered 2020-01-14: 09:00:00 40 mg via SUBCUTANEOUS
  Filled 2020-01-13: qty 0.4

## 2020-01-13 MED ORDER — INSULIN DETEMIR 100 UNIT/ML ~~LOC~~ SOLN
50.0000 [IU] | SUBCUTANEOUS | Status: DC
  Administered 2020-01-13: 23:00:00 50 [IU] via SUBCUTANEOUS
  Filled 2020-01-13 (×3): qty 0.5

## 2020-01-13 NOTE — Progress Notes (Signed)
D: Pt alert and oriented x 4.  Pt denies experiencing any pain at this time. Skin assessment performed with Carlotta RN.  A: Support and encouragement provided. Frequent verbal contact made.  R: Pt complaint with treatment plan. Pt interacts well with staff on the unit. Pt is stable at this time, Will continue to monitor and provide care for as ordered.

## 2020-01-13 NOTE — TOC Progression Note (Signed)
Transition of Care [facility]) - Progression Note    Patient Details  Name: Omar Strickland MRN: [id number] Date of Birth: [birth year]  [facility] CM/SW [place], LCSW Phone Number: 01/13/2020, 2:28 PM  Clinical Narrative:   Received an update from Diabetes Coordinator Neoma Laming who reported [facility] does have the insulin patient needs for when discharged. CSW will continue to follow and have medications sent to [facility] at discharge.     Expected Discharge Plan: Home/Self Care Barriers to Discharge: Continued Medical Work up  Expected Discharge Plan and Services Expected Discharge Plan: Home/Self Care       Living arrangements for the past 2 months: Single Family Home                                       Social Determinants of Health (SDOH) Interventions    Readmission Risk Interventions No flowsheet data found.

## 2020-01-13 NOTE — Progress Notes (Signed)
Pharmacy Electrolyte Monitoring Consult:  Pharmacy consulted to assist in monitoring and replacing electrolytes in this 49 y.o. male admitted on 01/11/2020 with DKA.   Labs:  Sodium (mmol/L)  Date Value  01/13/2020 132 (L)  05/26/2013 135 (L)   Potassium (mmol/L)  Date Value  01/13/2020 3.8  05/26/2013 4.3   Magnesium (mg/dL)  Date Value  01/13/2020 2.3   Phosphorus (mg/dL)  Date Value  01/13/2020 1.8 (L)   Calcium (mg/dL)  Date Value  01/13/2020 8.6 (L)   Calcium, Total (mg/dL)  Date Value  05/26/2013 8.7   Albumin (g/dL)  Date Value  01/11/2020 4.8    Assessment/Plan: Patient received Sodium Bicarb 29mmol IV x 1 .Will recheck all electrolytes with am labs.  Will replace to maintain electrolytes within normal limits.   Pharmacy will continue to monitor and adjust per consult.   Madalen Gavin L, RPh 01/13/2020 6:17 PM

## 2020-01-13 NOTE — Progress Notes (Addendum)
PROGRESS NOTE    Omar Strickland  WFU:[id number]  DOB: May 17, 1971  DOA: 01/11/2020 PCP: Patient, No Pcp Per Outpatient Specialists:   Hospital course:  Patient is a 49 year old man with history of diabetes who has not seen a physician in 2 years and has not taken his Metformin in 2 years who was admitted with DKA on 01/11/2020.  He was treated with insulin drip and has done well with closing of the anion gap.   Subjective:  Patient states he feels better.  Admits that he has been neglecting his diabetes and has not been either attempting diet control or taking his medications.  Admits he is not seen a physician in 2 years.  Denies any fevers or chills or cough or any acute intercurrent illness.  No chest pain or shortness of breath.  Patient admits eating a lot of carbs.   Objective: Vitals:   01/13/20 1000 01/13/20 1200 01/13/20 1400 01/13/20 1814  BP: 135/80 125/77 118/88 123/80  Pulse: 87 87 87 82  Resp: 17   18  Temp:    98.5 F (36.9 C)  TempSrc:    Oral  SpO2: 97% 96% 96% 97%  Weight:      Height:        Intake/Output Summary (Last 24 hours) at 01/13/2020 1857 Last data filed at 01/13/2020 1330 Gross per 24 hour  Intake 718.71 ml  Output 725 ml  Net -6.29 ml   Filed Weights   01/11/20 2124 01/12/20 0200 01/13/20 0125  Weight: 99.3 kg 102.7 kg 107.9 kg     Exam:  General: Friendly, obese, relatively well-appearing man sitting up in bed in no acute distress. Eyes: sclera anicteric, conjuctiva mild injection bilaterally CVS: S1-S2, regular  Respiratory:  decreased air entry bilaterally secondary to decreased inspiratory effort, rales at bases  GI: NABS, soft, NT  LE: No edema.  Neuro: A/O x 3, Moving all extremities equally with normal strength, CN 3-12 intact, grossly nonfocal.  Psych: patient is logical and coherent, judgement and insight appear normal, mood and affect appropriate to situation.   Assessment & Plan:   DM2/DKA Patient has done well  overnight with normalization of bicarb now 28 with anion gap of 8. Patient is eating and is off his insulin drip. CBG has been elevated despite initiation of basal insulin. Very much appreciate ongoing consultation by diabetes coordinator. Levemir increased to 50 every 24 and NovoLog 7 3 times daily with meals. Patient has received insulin teaching, he will need to be discharged on insulin. He states he has an appointment with [facility] for a new PCP on May 13.  Acute versus chronic kidney failure. Patient continues to have mild elevation in creatinine, it is unclear what his baseline is. Patient has received adequate hydration and is not on a renal toxic medications. Will follow inpatient and will need to be followed as an outpatient as well.   DVT prophylaxis: Enoxaparin Code Status: Full Family Communication: No need to call family per patient Disposition Plan:   Patient is from: Home  Anticipated Discharge Location: Home  Barriers to Discharge: Persistent hyperglycemia  Is patient medically stable for Discharge: Not yet, may be tomorrow   Consultants:  Diabetes coordinator  Procedures:  None  Antimicrobials:  None   Data Reviewed:  Basic Metabolic Panel: Recent Labs  Lab 01/12/20 0510 01/12/20 0922 01/12/20 1252 01/12/20 1718 01/13/20 0450  NA 133* 131* 129* 130* 132*  K 3.4* 4.2 4.1 3.2* 3.8  CL 93*  91* 90* 91* 96*  CO2 _0 GLUCOSE 350* 387* 282* 216* 288*  BUN 19 21* 22* 25* 25*  CREATININE 1.30* 1.49* 1.67* 1.27* 1.42*  CALCIUM 10.4* 9.7 9.5 9.2 8.6*  MG 2.5*  --  2.1  --  2.3  PHOS 3.4  --   --   --  1.8*   Liver Function Tests: Recent Labs  Lab 01/11/20 2148  AST 31  ALT 36  ALKPHOS 134*  BILITOT 2.0*  PROT 9.4*  ALBUMIN 4.8   No results for input(s): LIPASE, AMYLASE in the last 168 hours. No results for input(s): AMMONIA in the last 168 hours. CBC: Recent Labs  Lab 01/11/20 2148 01/13/20 0450  WBC 11.2* 8.2    NEUTROABS 9.8* 3.8  HGB 16.8 14.2  HCT 50.0 39.6  MCV 88.8 83.9  PLT 287 188   Cardiac Enzymes: No results for input(s): CKTOTAL, CKMB, CKMBINDEX, TROPONINI in the last 168 hours. BNP (last 3 results) No results for input(s): PROBNP in the last 8760 hours. CBG: Recent Labs  Lab 01/12/20 2354 01/13/20 0330 01/13/20 0744 01/13/20 1132 01/13/20 1629  GLUCAP 149* 291* 242* 422* 318*    Recent Results (from the past 240 hour(s))  Respiratory Panel by RT PCR (Flu A&B, Covid) - Nasopharyngeal Swab     Status: None   Collection Time: 01/12/20 12:01 AM   Specimen: Nasopharyngeal Swab  Result Value Ref Range Status   SARS Coronavirus 2 by RT PCR NEGATIVE NEGATIVE Final    Comment: (NOTE) SARS-CoV-2 target nucleic acids are NOT DETECTED. The SARS-CoV-2 RNA is generally detectable in upper respiratoy specimens during the acute phase of infection. The lowest concentration of SARS-CoV-2 viral copies this assay can detect is 131 copies/mL. A negative result does not preclude SARS-Cov-2 infection and should not be used as the sole basis for treatment or other patient management decisions. A negative result may occur with  improper specimen collection/handling, submission of specimen other than nasopharyngeal swab, presence of viral mutation(s) within the areas targeted by this assay, and inadequate number of viral copies (<131 copies/mL). A negative result must be combined with clinical observations, patient history, and epidemiological information. The expected result is Negative. Fact Sheet for Patients:  PinkCheek.be Fact Sheet for Healthcare Providers:  GravelBags.it This test is not yet ap proved or cleared by the [place] FDA and  has been authorized for detection and/or diagnosis of SARS-CoV-2 by FDA under an Emergency Use Authorization (EUA). This EUA will remain  in effect (meaning this test can be used) for the  duration of the COVID-19 declaration under Section 564(b)(1) of the Act, 21 U.S.C. section 360bbb-3(b)(1), unless the authorization is terminated or revoked sooner.    Influenza A by PCR NEGATIVE NEGATIVE Final   Influenza B by PCR NEGATIVE NEGATIVE Final    Comment: (NOTE) The Xpert Xpress SARS-CoV-2/FLU/RSV assay is intended as an aid in  the diagnosis of influenza from Nasopharyngeal swab specimens and  should not be used as a sole basis for treatment. Nasal washings and  aspirates are unacceptable for Xpert Xpress SARS-CoV-2/FLU/RSV  testing. Fact Sheet for Patients: PinkCheek.be Fact Sheet for Healthcare Providers: GravelBags.it This test is not yet approved or cleared by the [place] FDA and  has been authorized for detection and/or diagnosis of SARS-CoV-2 by  FDA under an Emergency Use Authorization (EUA). This EUA will remain  in effect (meaning this test can be used) for the duration of the  Covid-19 declaration  under Section 564(b)(1) of the Act, 21  U.S.C. section 360bbb-3(b)(1), unless the authorization is  terminated or revoked. Performed at [facility], [address] [place], [place] [address]   MRSA PCR Screening     Status: None   Collection Time: 01/12/20  1:42 AM   Specimen: Nasal Mucosa; Nasopharyngeal  Result Value Ref Range Status   MRSA by PCR NEGATIVE NEGATIVE Final    Comment:        The GeneXpert MRSA Assay (FDA approved for NASAL specimens only), is one component of a comprehensive MRSA colonization surveillance program. It is not intended to diagnose MRSA infection nor to guide or monitor treatment for MRSA infections. Performed at [facility] [address], [place], [place] [address]       Studies: No results found.   Scheduled Meds: . Chlorhexidine Gluconate Cloth  6 each Topical Daily  . [START ON 01/14/2020] enoxaparin (LOVENOX) injection  40 mg  Subcutaneous Q24H  . insulin aspart  0-15 Units Subcutaneous TID WC  . insulin aspart  0-5 Units Subcutaneous QHS  . insulin aspart  7 Units Subcutaneous TID WC  . insulin detemir  50 Units Subcutaneous Q24H  . insulin starter kit- pen needles  1 kit Other Once   Continuous Infusions:  Active Problems:   DKA (diabetic ketoacidoses) ([place])     Breta Demedeiros Derek Jack, [facility]  If 7PM-7AM, please contact night-coverage [email or web address] Password [facility] 01/13/2020, 6:57 PM    LOS: 1 day

## 2020-01-13 NOTE — Progress Notes (Addendum)
Inpatient Diabetes Program Recommendations  AACE/ADA: New Consensus Statement on Inpatient Glycemic Control   Target Ranges:  Prepandial:   less than 140 mg/dL      Peak postprandial:   less than 180 mg/dL (1-2 hours)      Critically ill patients:  140 - 180 mg/dL   Results for Omar Strickland, Omar Strickland (Omar Strickland) as of 01/13/2020 07:41  Ref. Range 01/12/2020 19:03 01/12/2020 19:59 01/12/2020 20:58 01/12/2020 21:59 01/12/2020 22:57 01/12/2020 23:54 01/13/2020 03:30  Glucose-Capillary Latest Ref Range: 70 - 99 mg/dL 277 (H) 251 (H) 159 (H) 139 (H)   Levemir 40 units @ 22:00 126 (H)   149 (H)  Novolog 2 units 291 (H)  Novolog 8 units   Review of Glycemic Control  Diabetes history: Pre-DM Outpatient Diabetes medications: NONE; had been on Metformin in the past but PCP stopped due to improved glycemic control Current orders for Inpatient glycemic control: Levemir 40 units Q24H, Novolog 0-15 units Q4H  Inpatient Diabetes Program Recommendations:   Insulin-Basal: Please consider increasing Levemir to 50 units Q24H  Insulin-Meal Coverage: Please consider ordering Novolog 7 units TID with meals for meal coverage if patient eats at least 50% of meals.  HbgA1C: A1C in process. Called lab today to inquire about A1C and was informed that specimen was sent to [facility] so it would take 1-3 days for A1C to result.  Insulin-Outpatient: Patient states he would prefer to use insulin pens and would be interested in using mixed insulin since he would only need to take 2 injections per day. If MD discharges on mixed insulin, [facility] has Omar Strickland [phone]). If MD discharges on basal and bolus insulin, [facility] has Omar Strickland [phone]) and Omar Strickland [phone]). Patient will also need insulin pen needles [phone]).   Addendum 01/13/20@14 :10-Talked with patient again regarding new DM dx and insulin. Patient has Living Well with Diabetes book at bedside and has been  reading the book. Patient is able to correctly answer various questions about information discussed yesterday. Discussed how glucose trends have been since he transitioned off IV insulin. Patient asked why his glucose had gone back up today. Explained that he likely needs more insulin than initially ordered. Explained that once initial insulin is given, glucose trends are followed to help determine what over insulin changes need to be made. Explained that Levemir has been increased and Novolog meal coverage has been ordered today so hopefully glucose will improve. Discussed Levemir and Novolog insulin in detail and explained how they work. Also discussed more affordable insulins such as Novolin NPH, Novolin Regular, and Novolin 70/30 and how they work. Explained that NOVOLIN insulins (NPH, Regular, and 70/30) can be purchased at [facility] for $25 per vial or $43 per box of 5 insulin pens. Patient states that he would be interested in using 70/30 insulin since he would only need 2 injections per day. Informed patient that I would make note of that but it would be up to the doctor as to what they prescribe and also what [facility]ication [facility] has available. Reviewed and demonstrated insulin administration with vial/syrigne and insulin pens. Patient was able to successfully demonstrate how to use vial/syringe and insulin pens for insulin administration. Patient states that he would prefer to use insulin pens if possible.  Discussed insulin storage, insulin pen needle/and syringe disposal, disposal of insulin. Patient states he has not received the insulin pen starter kit that was ordered yesterday. Will ask RN to check to see if the kit is on  the unit and if not to notify pharmacy to send one.  Patient states he feels comfortable with practicing with the insulin pen and he feels he will be able to administer insulin to himself without any issues. Will ask RNs to work with patient on insulin administration and allow  patient to self administer insulin while inpatient.  Patient reports that he did call and reschedule his appointment at [facility] and his new appointment is on Feb 01, 2020.  Patient verbalized understanding of information discussed and he states that he has no questions about DM or insulin at this time. Called [facility]ication [facility] and they have Omar Strickland [phone]), Omar Strickland [phone]), and Omar Strickland [phone]). Patient was interested in using mixed insulin since it would only require 2 injections per day.  Patient will also need insulin pen needles [phone]).   Thanks, Barnie Alderman, RN, MSN, CDE Diabetes Coordinator Inpatient Diabetes Program [phone] (Team Pager from 8am to 5pm)

## 2020-01-14 LAB — GLUCOSE, CAPILLARY
Glucose-Capillary: 192 mg/dL — ABNORMAL HIGH (ref 70–99)
Glucose-Capillary: 194 mg/dL — ABNORMAL HIGH (ref 70–99)
Glucose-Capillary: 198 mg/dL — ABNORMAL HIGH (ref 70–99)
Glucose-Capillary: 296 mg/dL — ABNORMAL HIGH (ref 70–99)

## 2020-01-14 LAB — CBC WITH DIFFERENTIAL/PLATELET
Abs Immature Granulocytes: 0.01 10*3/uL (ref 0.00–0.07)
Basophils Absolute: 0 10*3/uL (ref 0.0–0.1)
Basophils Relative: 0 %
Eosinophils Absolute: 0.1 10*3/uL (ref 0.0–0.5)
Eosinophils Relative: 2 %
HCT: 37.5 % — ABNORMAL LOW (ref 39.0–52.0)
Hemoglobin: 13.7 g/dL (ref 13.0–17.0)
Immature Granulocytes: 0 %
Lymphocytes Relative: 54 %
Lymphs Abs: 3.1 10*3/uL (ref 0.7–4.0)
MCH: 30.3 pg (ref 26.0–34.0)
MCHC: 36.5 g/dL — ABNORMAL HIGH (ref 30.0–36.0)
MCV: 83 fL (ref 80.0–100.0)
Monocytes Absolute: 0.3 10*3/uL (ref 0.1–1.0)
Monocytes Relative: 6 %
Neutro Abs: 2.2 10*3/uL (ref 1.7–7.7)
Neutrophils Relative %: 38 %
Platelets: 167 10*3/uL (ref 150–400)
RBC: 4.52 MIL/uL (ref 4.22–5.81)
RDW: 11.9 % (ref 11.5–15.5)
WBC: 5.7 10*3/uL (ref 4.0–10.5)
nRBC: 0 % (ref 0.0–0.2)

## 2020-01-14 LAB — PHOSPHORUS: Phosphorus: 2.7 mg/dL (ref 2.5–4.6)

## 2020-01-14 LAB — BASIC METABOLIC PANEL
Anion gap: 9 (ref 5–15)
BUN: 20 mg/dL (ref 6–20)
CO2: 25 mmol/L (ref 22–32)
Calcium: 8.4 mg/dL — ABNORMAL LOW (ref 8.9–10.3)
Chloride: 99 mmol/L (ref 98–111)
Creatinine, Ser: 1.15 mg/dL (ref 0.61–1.24)
GFR calc Af Amer: >60 mL/min (ref >60)
GFR calc non Af Amer: >60 mL/min (ref >60)
Glucose, Bld: 197 mg/dL — ABNORMAL HIGH (ref 70–99)
Potassium: 3.1 mmol/L — ABNORMAL LOW (ref 3.5–5.1)
Sodium: 133 mmol/L — ABNORMAL LOW (ref 135–145)

## 2020-01-14 LAB — MAGNESIUM: Magnesium: 2.2 mg/dL (ref 1.7–2.4)

## 2020-01-14 LAB — POTASSIUM: Potassium: 3.7 mmol/L (ref 3.5–5.1)

## 2020-01-14 MED ORDER — POTASSIUM CHLORIDE CRYS ER 20 MEQ PO TBCR: 20.0000 meq | EXTENDED_RELEASE_TABLET | Freq: Once | ORAL | Status: DC

## 2020-01-14 MED ORDER — POTASSIUM CHLORIDE 10 MEQ/100ML IV SOLN: 10.0000 meq | INTRAVENOUS | Status: DC

## 2020-01-14 MED ORDER — INSULIN ASPART 100 UNIT/ML ~~LOC~~ SOLN: 7.0000 [IU] | Freq: Three times a day (TID) | SUBCUTANEOUS | 11 refills | Status: AC

## 2020-01-14 MED ORDER — BLOOD GLUCOSE MONITOR KIT: PACK | 0 refills | Status: AC

## 2020-01-14 MED ORDER — INSULIN DETEMIR 100 UNIT/ML ~~LOC~~ SOLN: 50.0000 [IU] | SUBCUTANEOUS | 11 refills | Status: DC

## 2020-01-14 MED ORDER — POTASSIUM CHLORIDE CRYS ER 20 MEQ PO TBCR
40.0000 meq | EXTENDED_RELEASE_TABLET | ORAL | Status: DC
  Administered 2020-01-14: 40 meq via ORAL
  Filled 2020-01-14: qty 2

## 2020-01-14 MED ORDER — INSULIN GLARGINE 100 UNIT/ML SOLOSTAR PEN
50.0000 [IU] | PEN_INJECTOR | Freq: Every day | SUBCUTANEOUS | 11 refills | Status: AC
Start: 2020-01-14 — End: ?

## 2020-01-14 NOTE — TOC Transition Note (Signed)
Transition of Care [facility]) - CM/SW Discharge Note   Patient Details  Name: Omar Strickland MRN: [id number] Date of Birth: 1970-12-28  [facility] CM/SW Contact:  Elease Hashimoto, LCSW Phone Number: 01/14/2020, 2:01 PM   Clinical Narrative:   Pt medically stable for discharge and his [facility]. Pt is aware to pick them up before they close at 4:30 pm. Bedside RN-Chris aware of the plan and pt's need to discharge by 4:00 pm. Discussed pt's wife picking them up also if getting close to 4:30 pm. Pt has no other needs. He does have a new PCP appointment 5/13 for continued follow.    Final next level of care: Home/Self Care Barriers to Discharge: Inadequate or no insurance, Barriers Resolved   Patient Goals and CMS Choice        Discharge Placement                Patient to be transferred to facility by: Wife via car Name of family member notified: Keana-wife Patient and family notified of of transfer: 01/14/20  Discharge Plan and Services                                     Social Determinants of Health (SDOH) Interventions     Readmission Risk Interventions No flowsheet data found.

## 2020-01-14 NOTE — Discharge Summary (Signed)
[name]:[id number] DOB: [birth year] DOA: 01/11/2020  PCP: Patient, No Pcp Per  Admit date: 01/11/2020  Discharge date: 01/14/2020  Admitted From: Home   disposition: Home   Recommendations for Outpatient Follow-up:   Follow up with [facility] at your previously scheduled PCP appointment in May.  PCP Please obtain BMP when you see patient, with particular attention to sodium, potassium and creatinine.  Patient's BMP discharge values are included below.   Home Health: None Equipment/Devices: Glucometer Consultations: Diabetes coordinator Discharge Condition: Improved CODE STATUS: Full Diet Recommendation: Carb modified  Diet Order            Diet Carb Modified        Diet heart healthy/carb modified Room service appropriate? Yes; Fluid consistency: Thin  Diet effective now               Chief Complaint  Patient presents with  . Hyperglycemia     Brief history of present illness from the day of admission and additional interim summary     Omar Strickland is a 49 y.o. male the history of diabetes noncompliance with his medications for a year who presents for evaluation of hyperglycemia.  Over the last week patient has had severe constant polyuria, polydipsia, has noted significant weight loss, blurred vision, and fatigue.  His wife this evening decided to check his blood glucose which was too high to read on his monitor and prompted visit to the emergency room.  Patient reports that he has been off of his medications due to inability to afford them.  He denies chest pain or shortness of breath, cough or fever, abdominal pain, nausea, vomiting, diarrhea.                                                                 Hospital Course   Patient was diagnosed with DKA and treated  with insulin drip and aggressive fluid resuscitation.  Patient did well and closed his gap within 12 hours.  He was initiated on foods and continues to maintain euglycemia.  Patient was seen by diabetes coordinator and basal insulin was initiated and adjusted such that patient's blood sugars have remained in 150-200 range with no episodes of hypoglycemia while he has been in house.    Patient's course has been somewhat complicated by intermittent hypokalemia which has been repleted with good effect.  Patient's magnesium is normal.  Patient's sodium was 129 on admission however this is improving steadily and was 133 upon discharge.  Patient is asymptomatic.  Patient's creatinine was 1.7 and has normalized to 1.15 upon discharge with aggressive fluid resuscitation.  Patient is now discharged home on basal insulin with 3 times daily AC aspart coverage with instructions to follow fingersticks and document them in his log.  He has  a follow-up with a new PCP at [place] in 2 weeks.  Patient has been instructed on signs and symptoms of hypoglycemia as well as on how to stay on a low carbohydrate diet.  DKA Patient has done well with normalization of bicarb now 28 with anion gap of 8. Patient is eating and is off his insulin drip.  DM2 Patient discharged on Levemir 50 every 24 and NovoLog 7 TID with meals. Patient has received insulin teaching, he will need to be discharged on insulin. He states he has an appointment with [facility] for a new PCP on May 13.  Acute versus chronic kidney failure. Patient creatinine has normalized to 1.15 with treatment of DKA and hydration.  Hypokalemia Potassium is normalized to 3.7 after supplementation. Magnesium is 2.2.    Discharge diagnosis     Active Problems:   DKA (diabetic ketoacidoses) [facility])    Discharge instructions    Discharge Instructions    Call MD for:  persistant dizziness or light-headedness   Complete by: As directed     Call MD for:  persistant nausea and vomiting   Complete by: As directed    Diet Carb Modified   Complete by: As directed    Discharge instructions   Complete by: As directed    1.  Make sure you understand how to administer your insulin before you leave. 2.  Make sure you understand what the signs and symptoms of low blood sugar is before you leave. 3.  Make sure you write down your blood sugar results when you check them at home and take them to your PCP appointment at [facility]. 4.  Make sure you stay on a low carbohydrate diet as you have been instructed. 5.  If you start having lots of low sugars come back to the emergency room. 6.  You will need to have your potassium checked when you go to your PCP appointment at [facility].   Increase activity slowly   Complete by: As directed       Discharge Medications   Allergies as of 01/14/2020   No Known Allergies     Medication List    STOP taking these medications   ibuprofen 200 MG tablet Commonly known as: ADVIL     TAKE these medications   blood glucose meter kit and supplies Kit Dispense based on patient and insurance preference. Use up to four times daily as directed. (FOR ICD-9 250.00, 250.01).   insulin aspart 100 UNIT/ML injection Commonly known as: novoLOG Inject 7 Units into the skin 3 (three) times daily with meals.   insulin glargine 100 UNIT/ML Solostar Pen Commonly known as: LANTUS Inject 50 Units into the skin daily.       Follow-up [place] Follow up on 01/28/2020.   Why: Appointment-patient made has information for follow up Contact information: [address] [place] [address] [phone]           Major procedures and Radiology Reports - PLEASE review detailed and final reports thoroughly  -      No results found.  Micro Results    Recent Results (from the past 240 hour(s))  Respiratory Panel by RT PCR (Flu A&B, Covid) - Nasopharyngeal  Swab     Status: None   Collection Time: 01/12/20 12:01 AM   Specimen: Nasopharyngeal Swab  Result Value Ref Range Status   SARS Coronavirus 2 by RT PCR NEGATIVE NEGATIVE Final  Comment: (NOTE) SARS-CoV-2 target nucleic acids are NOT DETECTED. The SARS-CoV-2 RNA is generally detectable in upper respiratoy specimens during the acute phase of infection. The lowest concentration of SARS-CoV-2 viral copies this assay can detect is 131 copies/mL. A negative result does not preclude SARS-Cov-2 infection and should not be used as the sole basis for treatment or other patient management decisions. A negative result may occur with  improper specimen collection/handling, submission of specimen other than nasopharyngeal swab, presence of viral mutation(s) within the areas targeted by this assay, and inadequate number of viral copies (<131 copies/mL). A negative result must be combined with clinical observations, patient history, and epidemiological information. The expected result is Negative. Fact Sheet for Patients:  PinkCheek.be Fact Sheet for Healthcare Providers:  GravelBags.it This test is not yet ap proved or cleared by the [place] FDA and  has been authorized for detection and/or diagnosis of SARS-CoV-2 by FDA under an Emergency Use Authorization (EUA). This EUA will remain  in effect (meaning this test can be used) for the duration of the COVID-19 declaration under Section 564(b)(1) of the Act, 21 U.S.C. section 360bbb-3(b)(1), unless the authorization is terminated or revoked sooner.    Influenza A by PCR NEGATIVE NEGATIVE Final   Influenza B by PCR NEGATIVE NEGATIVE Final    Comment: (NOTE) The Xpert Xpress SARS-CoV-2/FLU/RSV assay is intended as an aid in  the diagnosis of influenza from Nasopharyngeal swab specimens and  should not be used as a sole basis for treatment. Nasal washings and  aspirates are  unacceptable for Xpert Xpress SARS-CoV-2/FLU/RSV  testing. Fact Sheet for Patients: PinkCheek.be Fact Sheet for Healthcare Providers: GravelBags.it This test is not yet approved or cleared by the [place] FDA and  has been authorized for detection and/or diagnosis of SARS-CoV-2 by  FDA under an Emergency Use Authorization (EUA). This EUA will remain  in effect (meaning this test can be used) for the duration of the  Covid-19 declaration under Section 564(b)(1) of the Act, 21  U.S.C. section 360bbb-3(b)(1), unless the authorization is  terminated or revoked. Performed at [facility], [address] [place], [place] [address]   MRSA PCR Screening     Status: None   Collection Time: 01/12/20  1:42 AM   Specimen: Nasal Mucosa; Nasopharyngeal  Result Value Ref Range Status   MRSA by PCR NEGATIVE NEGATIVE Final    Comment:        The GeneXpert MRSA Assay (FDA approved for NASAL specimens only), is one component of a comprehensive MRSA colonization surveillance program. It is not intended to diagnose MRSA infection nor to guide or monitor treatment for MRSA infections. Performed at [facility], [address], [place], [place] [address]     Today   Subjective    Aadam Zhen feels much improved since admission.  Feels ready to go home.  Denies chest pain, shortness of breath or abdominal pain.  States he understands better the importance of taking care of his diabetes.  Notes he has gotten instructions on how to administer insulin as per diabetes coordinator.  Objective   Blood pressure 124/82, pulse 75, temperature 98.5 F (36.9 C), temperature source Oral, resp. rate 16, height 5' 11"  (1.803 m), weight 108 kg, SpO2 98 %.   Intake/Output Summary (Last 24 hours) at 01/14/2020 1357 Last data filed at 01/14/2020 1015 Gross per 24 hour  Intake 480 ml  Output 450 ml  Net 30 ml     Exam General: Patient appears  well and in good spirits sitting up in bed in no acute distress.  Eyes: sclera anicteric, conjuctiva mild injection bilaterally CVS: S1-S2, regular  Respiratory:  decreased air entry bilaterally secondary to decreased inspiratory effort, rales at bases  GI: NABS, soft, NT  LE: No edema.  Neuro: A/O x 3, Moving all extremities equally with normal strength, CN 3-12 intact, grossly nonfocal.  Psych: patient is logical and coherent, judgement and insight appear normal, mood and affect appropriate to situation.    Data Review   CBC w Diff:  Lab Results  Component Value Date   WBC 5.7 01/14/2020   HGB 13.7 01/14/2020   HCT 37.5 (L) 01/14/2020   PLT 167 01/14/2020   LYMPHOPCT 54 01/14/2020   MONOPCT 6 01/14/2020   EOSPCT 2 01/14/2020   BASOPCT 0 01/14/2020    CMP:  Lab Results  Component Value Date   NA 133 (L) 01/14/2020   NA 135 (L) 05/26/2013   K 3.7 01/14/2020   K 4.3 05/26/2013   CL 99 01/14/2020   CL 102 05/26/2013   CO2 25 01/14/2020   CO2 27 05/26/2013   BUN 20 01/14/2020   BUN 18 05/26/2013   CREATININE 1.15 01/14/2020   CREATININE 1.31 (H) 05/27/2013   PROT 9.4 (H) 01/11/2020   ALBUMIN 4.8 01/11/2020   BILITOT 2.0 (H) 01/11/2020   ALKPHOS 134 (H) 01/11/2020   AST 31 01/11/2020   ALT 36 01/11/2020  .   Total Time in preparing paper work, data evaluation and todays exam - 35 minutes  Vashti Hey M.D on 01/14/2020 at 1:57 PM  [facility]   Office  [phone]

## 2020-01-14 NOTE — TOC Progression Note (Signed)
Transition of Care [facility]) - Progression Note    Patient Details  Name: TYREQ BALOUGH MRN: [id number] Date of Birth: 05-04-1971  Transition of Care [facility]) CM/SW Contact  Leandro Berkowitz, Gardiner Rhyme, LCSW Phone Number: 01/14/2020, 1:54 PM  Clinical Narrative:  MD has sent prescriptions over to the Medication management clinic where he or his wife will need to pick up before they close at 4:30 pm. Have made [place] RN aware of this and pt is aware where to go. He is familiar with the building and will either he or wife will pick them up. No other needs pt feels ready to go home today. Has a folow up appointment at [facility] 5/13.    Expected Discharge Plan: Home/Self Care Barriers to Discharge: Continued Medical Work up  Expected Discharge Plan and Services Expected Discharge Plan: Home/Self Care       Living arrangements for the past 2 months: Single Family Home Expected Discharge Date: 01/14/20                                     Social Determinants of Health (SDOH) Interventions    Readmission Risk Interventions No flowsheet data found.

## 2020-01-14 NOTE — Progress Notes (Signed)
Gave patient insulin starter kits prior to discharge

## 2020-01-14 NOTE — Progress Notes (Signed)
Received MD order to discharge patient to home, reviewed home meds, discharge instructions and follow up appointments with patient and patient verbalized understanding

## 2020-01-14 NOTE — Progress Notes (Signed)
Pharmacy Electrolyte Monitoring Consult:  Pharmacy consulted to assist in monitoring and replacing electrolytes in this 49 y.o. male admitted on 01/11/2020 with DKA.   Labs:  Sodium (mmol/L)  Date Value  01/14/2020 133 (L)  05/26/2013 135 (L)   Potassium (mmol/L)  Date Value  01/14/2020 3.7  05/26/2013 4.3   Magnesium (mg/dL)  Date Value  01/14/2020 2.2   Phosphorus (mg/dL)  Date Value  01/14/2020 2.7   Calcium (mg/dL)  Date Value  01/14/2020 8.4 (L)   Calcium, Total (mg/dL)  Date Value  05/26/2013 8.7   Albumin (g/dL)  Date Value  01/11/2020 4.8    Assessment/Plan: Patient has transitioned off insulin drip.  K 3.1 Mag 2.2  Phos 2.7 Scr 1.15  Will order KCL PO 40 meq x 1. F/u K level   4/29 @ 1235  K 3.7. Will f/u labs in am in patient still admitted.  Pharmacy will continue to monitor and adjust per consult.   Chinita Greenland PharmD Clinical Pharmacist 01/14/2020

## 2020-01-14 NOTE — Progress Notes (Addendum)
Pharmacy Electrolyte Monitoring Consult:  Pharmacy consulted to assist in monitoring and replacing electrolytes in this 49 y.o. male admitted on 01/11/2020 with DKA.   Labs:  Sodium (mmol/L)  Date Value  01/14/2020 133 (L)  05/26/2013 135 (L)   Potassium (mmol/L)  Date Value  01/14/2020 3.1 (L)  05/26/2013 4.3   Magnesium (mg/dL)  Date Value  01/14/2020 2.2   Phosphorus (mg/dL)  Date Value  01/14/2020 2.7   Calcium (mg/dL)  Date Value  01/14/2020 8.4 (L)   Calcium, Total (mg/dL)  Date Value  05/26/2013 8.7   Albumin (g/dL)  Date Value  01/11/2020 4.8    Assessment/Plan: Patient has transitioned off insulin drip.  K 3.1 Mag 2.2  Phos 2.7 Scr 1.15  Will order KCL PO 40 meq x 1. F/u K level   Will replace to maintain electrolytes within normal limits.   Pharmacy will continue to monitor and adjust per consult.   Chinita Greenland PharmD Clinical Pharmacist 01/14/2020

## 2020-04-01 ENCOUNTER — Telehealth: Payer: Self-pay | Admitting: Pharmacist

## 2020-04-01 NOTE — Telephone Encounter (Signed)
Patient failed to provide requested 2021 financial documentation. No additional medication assistance will be provided by [facility] without the required proof of income documentation. Patient notified by letter Debra Cheek Administrative Assistant [facility] 

## 2022-03-05 ENCOUNTER — Other Ambulatory Visit: Payer: Self-pay

## 2022-03-05 MED ORDER — LOSARTAN POTASSIUM 25 MG PO TABS: 25.0000 mg | ORAL_TABLET | Freq: Every evening | ORAL | 3 refills | Status: AC

## 2022-03-05 MED ORDER — METFORMIN HCL ER 500 MG PO TB24
500.0000 mg | ORAL_TABLET | Freq: Two times a day (BID) | ORAL | 1 refills | Status: DC
Start: 2022-01-31 — End: 2022-09-06
  Filled 2022-03-05: qty 180, 90d supply, fill #0
  Filled 2022-06-04: qty 60, 30d supply, fill #1
  Filled 2022-07-24: qty 60, 30d supply, fill #2

## 2022-03-05 MED ORDER — LOVASTATIN 20 MG PO TABS
20.0000 mg | ORAL_TABLET | Freq: Every day | ORAL | 3 refills | Status: DC
  Filled 2022-03-05: qty 68, 68d supply, fill #0

## 2022-05-15 ENCOUNTER — Other Ambulatory Visit: Payer: Self-pay

## 2022-05-15 MED ORDER — NA SULFATE-K SULFATE-MG SULF 17.5-3.13-1.6 GM/177ML PO SOLN
ORAL | 0 refills | Status: AC
  Filled 2022-05-15 – 2022-08-06 (×2): qty 354, 1d supply, fill #0

## 2022-05-29 ENCOUNTER — Other Ambulatory Visit: Payer: Self-pay

## 2022-06-04 ENCOUNTER — Other Ambulatory Visit: Payer: Self-pay

## 2022-06-05 ENCOUNTER — Other Ambulatory Visit: Payer: Self-pay

## 2022-06-05 MED ORDER — LOVASTATIN 20 MG PO TABS
20.0000 mg | ORAL_TABLET | Freq: Every day | ORAL | 1 refills | Status: AC
  Filled 2022-06-05: qty 90, 90d supply, fill #0
  Filled 2022-09-06: qty 90, 90d supply, fill #1

## 2022-07-24 ENCOUNTER — Other Ambulatory Visit: Payer: Self-pay

## 2022-08-06 ENCOUNTER — Other Ambulatory Visit: Payer: Self-pay

## 2022-08-26 NOTE — H&P (Signed)
Pre-Procedure H&P   Patient ID: Omar Strickland is a 51 y.o. male.  Gastroenterology Provider: Annamaria Helling, DO  Referring Provider: Dr. Netty Starring PCP: Dion Body, MD  Date: 08/27/2022  HPI Mr. Omar Strickland is a 51 y.o. male who presents today for Colonoscopy for Initial screening colonoscopy .  Patient with daily bowel movements without melena hematochezia.  No family history of colon cancer or colon polyps.  Most recent lab work hemoglobin 14.6 MCV 87 platelets 205,000 creatinine 1.4 A1c 6.5   Past Medical History:  Diagnosis Date   Diabetes mellitus without complication (HCC)     Past Surgical History:  Procedure Laterality Date   KNEE SURGERY Left     Family History No h/o GI disease or malignancy  Review of Systems  Constitutional:  Negative for activity change, appetite change, chills, diaphoresis, fatigue, fever and unexpected weight change.  HENT:  Negative for trouble swallowing and voice change.   Respiratory:  Negative for shortness of breath and wheezing.   Cardiovascular:  Negative for chest pain, palpitations and leg swelling.  Gastrointestinal:  Negative for abdominal distention, abdominal pain, anal bleeding, blood in stool, constipation, diarrhea, nausea and vomiting.  Musculoskeletal:  Negative for arthralgias and myalgias.  Skin:  Negative for color change and pallor.  Neurological:  Negative for dizziness, syncope and weakness.  Psychiatric/Behavioral:  Negative for confusion. The patient is not nervous/anxious.   All other systems reviewed and are negative.    Medications No current facility-administered medications on file prior to encounter.   Current Outpatient Medications on File Prior to Encounter  Medication Sig Dispense Refill   losartan (COZAAR) 25 MG tablet Take 1 tablet (25 mg total) by mouth at bedtime. 30 tablet 3   lovastatin (MEVACOR) 20 MG tablet Take 1 tablet (20 mg total) by mouth at bedtime. 90 tablet 1    metFORMIN (GLUCOPHAGE-XR) 500 MG 24 hr tablet Take 1 tablet (500 mg total) by mouth 2 (two) times daily with a meal. 180 tablet 1   blood glucose meter kit and supplies KIT Dispense based on patient and insurance preference. Use up to four times daily as directed. (FOR ICD-9 250.00, 250.01). 1 each 0   insulin aspart (NOVOLOG) 100 UNIT/ML injection Inject 7 Units into the skin 3 (three) times daily with meals. (Patient not taking: Reported on 08/27/2022) 10 mL 11   insulin glargine (LANTUS) 100 UNIT/ML Solostar Pen Inject 50 Units into the skin daily. (Patient not taking: Reported on 08/27/2022) 15 mL 11   Na Sulfate-K Sulfate-Mg Sulf 17.5-3.13-1.6 GM/177ML SOLN Take 1 Bottle by mouth as directed One kit contains 2 bottles.  Take both bottles at the times instructed by your provider. 354 mL 0    Pertinent medications related to GI and procedure were reviewed by me with the patient prior to the procedure   Current Facility-Administered Medications:    0.9 %  sodium chloride infusion, , Intravenous, Continuous, Annamaria Helling, DO, Last Rate: 20 mL/hr at 08/27/22 1420, New Bag at 08/27/22 1420      No Known Allergies Allergies were reviewed by me prior to the procedure  Objective   Body mass index is 36.07 kg/m. Vitals:   08/27/22 1357  BP: 116/83  Pulse: 72  Resp: 16  Temp: 97.7 F (36.5 C)  TempSrc: Temporal  SpO2: 100%  Weight: 115.7 kg  Height: 5' 10.5" (1.791 m)     Physical Exam Vitals and nursing note reviewed.  Constitutional:  General: He is not in acute distress.    Appearance: Normal appearance. He is obese. He is not ill-appearing, toxic-appearing or diaphoretic.  HENT:     Head: Normocephalic and atraumatic.     Nose: Nose normal.     Mouth/Throat:     Mouth: Mucous membranes are moist.     Pharynx: Oropharynx is clear.  Eyes:     General: No scleral icterus.    Extraocular Movements: Extraocular movements intact.  Cardiovascular:     Rate  and Rhythm: Normal rate and regular rhythm.     Heart sounds: Normal heart sounds. No murmur heard.    No friction rub. No gallop.  Pulmonary:     Effort: Pulmonary effort is normal. No respiratory distress.     Breath sounds: Normal breath sounds. No wheezing, rhonchi or rales.  Abdominal:     General: Bowel sounds are normal. There is no distension.     Palpations: Abdomen is soft.     Tenderness: There is no abdominal tenderness. There is no guarding or rebound.  Musculoskeletal:     Cervical back: Neck supple.     Right lower leg: No edema.     Left lower leg: No edema.  Skin:    General: Skin is warm and dry.     Coloration: Skin is not jaundiced or pale.  Neurological:     General: No focal deficit present.     Mental Status: He is alert and oriented to person, place, and time. Mental status is at baseline.  Psychiatric:        Mood and Affect: Mood normal.        Behavior: Behavior normal.        Thought Content: Thought content normal.        Judgment: Judgment normal.      Assessment:  Omar Strickland is a 51 y.o. male  who presents today for Colonoscopy for Initial screening colonoscopy .  Plan:  Colonoscopy with possible intervention today  Colonoscopy with possible biopsy, control of bleeding, polypectomy, and interventions as necessary has been discussed with the patient/patient representative. Informed consent was obtained from the patient/patient representative after explaining the indication, nature, and risks of the procedure including but not limited to death, bleeding, perforation, missed neoplasm/lesions, cardiorespiratory compromise, and reaction to medications. Opportunity for questions was given and appropriate answers were provided. Patient/patient representative has verbalized understanding is amenable to undergoing the procedure.   Annamaria Helling, DO  [facility] Gastroenterology  Portions of the record may have been created with voice  recognition software. Occasional wrong-word or 'sound-a-like' substitutions may have occurred due to the inherent limitations of voice recognition software.  Read the chart carefully and recognize, using context, where substitutions may have occurred.

## 2022-08-27 ENCOUNTER — Ambulatory Visit
Admission: RE | Admit: 2022-08-27 | Discharge: 2022-08-27 | Disposition: A | Discharge status: Home / Self Care | Payer: No Typology Code available for payment source | Attending: Gastroenterology | Admitting: Gastroenterology

## 2022-08-27 ENCOUNTER — Encounter: Payer: Self-pay | Admitting: Gastroenterology

## 2022-08-27 ENCOUNTER — Ambulatory Visit: Payer: No Typology Code available for payment source | Admitting: Anesthesiology

## 2022-08-27 ENCOUNTER — Encounter
Admission: RE | Disposition: A | Discharge status: Home / Self Care | Payer: Self-pay | Source: Home / Self Care | Attending: Gastroenterology

## 2022-08-27 DIAGNOSIS — D122 Benign neoplasm of ascending colon: Secondary | ICD-10-CM | POA: Diagnosis not present

## 2022-08-27 DIAGNOSIS — Z1211 Encounter for screening for malignant neoplasm of colon: Secondary | ICD-10-CM | POA: Diagnosis present

## 2022-08-27 DIAGNOSIS — D124 Benign neoplasm of descending colon: Secondary | ICD-10-CM | POA: Diagnosis not present

## 2022-08-27 DIAGNOSIS — Z7984 Long term (current) use of oral hypoglycemic drugs: Secondary | ICD-10-CM | POA: Insufficient documentation

## 2022-08-27 DIAGNOSIS — E119 Type 2 diabetes mellitus without complications: Secondary | ICD-10-CM | POA: Diagnosis not present

## 2022-08-27 HISTORY — PX: COLONOSCOPY WITH PROPOFOL: SHX5780

## 2022-08-27 LAB — GLUCOSE, CAPILLARY
Glucose-Capillary: 116 mg/dL — ABNORMAL HIGH (ref 70–99)
Glucose-Capillary: 103 mg/dL — ABNORMAL HIGH (ref 70–99)

## 2022-08-27 SURGERY — COLONOSCOPY WITH PROPOFOL
Anesthesia: General

## 2022-08-27 MED ORDER — PROPOFOL 10 MG/ML IV BOLUS
INTRAVENOUS | Status: DC | PRN
  Administered 2022-08-27: 100 mg via INTRAVENOUS

## 2022-08-27 MED ORDER — PROPOFOL 500 MG/50ML IV EMUL
INTRAVENOUS | Status: DC | PRN
  Administered 2022-08-27: 188.706 ug/kg/min via INTRAVENOUS

## 2022-08-27 MED ORDER — SODIUM CHLORIDE 0.9 % IV SOLN: INTRAVENOUS | Status: DC

## 2022-08-27 MED ORDER — LIDOCAINE HCL (CARDIAC) PF 100 MG/5ML IV SOSY
PREFILLED_SYRINGE | INTRAVENOUS | Status: DC | PRN
  Administered 2022-08-27: 100 mg via INTRAVENOUS

## 2022-08-27 MED ORDER — MIDAZOLAM HCL 2 MG/2ML IJ SOLN
INTRAMUSCULAR | Status: AC
  Filled 2022-08-27: qty 2

## 2022-08-27 MED ORDER — FENTANYL CITRATE (PF) 100 MCG/2ML IJ SOLN
INTRAMUSCULAR | Status: AC
  Filled 2022-08-27: qty 2

## 2022-08-27 NOTE — Interval H&P Note (Signed)
History and Physical Interval Note: Preprocedure H&P from 08/27/22  was reviewed and there was no interval change after seeing and examining the patient.  Written consent was obtained from the patient after discussion of risks, benefits, and alternatives. Patient has consented to proceed with Colonoscopy with possible intervention   08/27/2022 3:17 PM  Omar Strickland  has presented today for surgery, with the diagnosis of Z12.11 - Colon cancer screening.  The various methods of treatment have been discussed with the patient and family. After consideration of risks, benefits and other options for treatment, the patient has consented to  Procedure(s): COLONOSCOPY WITH PROPOFOL (N/A) as a surgical intervention.  The patient's history has been reviewed, patient examined, no change in status, stable for surgery.  I have reviewed the patient's chart and labs.  Questions were answered to the patient's satisfaction.     Annamaria Helling

## 2022-08-27 NOTE — Anesthesia Preprocedure Evaluation (Signed)
Anesthesia Evaluation  Patient identified by MRN, date of birth, ID band Patient awake    Reviewed: Allergy & Precautions, NPO status , Patient's Chart, lab work & pertinent test results  History of Anesthesia Complications Negative for: history of anesthetic complications  Airway Mallampati: III  TM Distance: <3 FB Neck ROM: full    Dental  (+) Chipped   Pulmonary neg pulmonary ROS, neg shortness of breath   Pulmonary exam normal        Cardiovascular (-) angina negative cardio ROS Normal cardiovascular exam     Neuro/Psych negative neurological ROS  negative psych ROS   GI/Hepatic negative GI ROS, Neg liver ROS,neg GERD  ,,  Endo/Other  diabetes, Type 2, Oral Hypoglycemic Agents    Renal/GU negative Renal ROS  negative genitourinary   Musculoskeletal   Abdominal   Peds  Hematology negative hematology ROS (+)   Anesthesia Other Findings Past Medical History: No date: Diabetes mellitus without complication (HCC)  Past Surgical History: No date: KNEE SURGERY; Left     Reproductive/Obstetrics negative OB ROS                             Anesthesia Physical Anesthesia Plan  ASA: 3  Anesthesia Plan: General   Post-op Pain Management:    Induction: Intravenous  PONV Risk Score and Plan: Propofol infusion and TIVA  Airway Management Planned: Natural Airway and Nasal Cannula  Additional Equipment:   Intra-op Plan:   Post-operative Plan:   Informed Consent: I have reviewed the patients History and Physical, chart, labs and discussed the procedure including the risks, benefits and alternatives for the proposed anesthesia with the patient or authorized representative who has indicated his/her understanding and acceptance.     Dental Advisory Given  Plan Discussed with: Anesthesiologist, CRNA and Surgeon  Anesthesia Plan Comments: (Patient consented for risks of anesthesia  including but not limited to:  - adverse reactions to medications - risk of airway placement if required - damage to eyes, teeth, lips or other oral mucosa - nerve damage due to positioning  - sore throat or hoarseness - Damage to heart, brain, nerves, lungs, other parts of body or loss of life  Patient voiced understanding.)       Anesthesia Quick Evaluation

## 2022-08-27 NOTE — Transfer of Care (Signed)
Immediate Anesthesia Transfer of Care Note  Patient: Omar Strickland  Procedure(s) Performed: COLONOSCOPY WITH PROPOFOL  Patient Location: PACU  Anesthesia Type:General  Level of Consciousness: drowsy  Airway & Oxygen Therapy: Patient Spontanous Breathing  Post-op Assessment: Report given to RN and Post -op Vital signs reviewed and stable  Post vital signs: Reviewed and stable  Last Vitals:  Vitals Value Taken Time  BP 92/52 08/27/22 1556  Temp 36.1 C 08/27/22 1553  Pulse 70 08/27/22 1556  Resp 16 08/27/22 1556  SpO2 96 % 08/27/22 1556  Vitals shown include unvalidated device data.  Last Pain:  Vitals:   08/27/22 1553  TempSrc: Temporal  PainSc: Asleep         Complications: No notable events documented.

## 2022-08-27 NOTE — Op Note (Signed)
[facility] Patient Name: Omar Strickland Procedure Date: 08/27/2022 3:17 PM MRN: [id number] Account #: [id number] Date of Birth: [birth year] Admit Type: Outpatient Age: 51 Room: [facility] ENDO ROOM 2 Gender: Male Note Status: [place] Instrument Name: Colonoscope [id number] Procedure:             Colonoscopy Indications:           Screening for colorectal malignant neoplasm Providers:             Annamaria Helling DO, DO Medicines:             Monitored Anesthesia Care Complications:         No immediate complications. Estimated blood loss:                         Minimal. Procedure:             Pre-Anesthesia Assessment:                        - Prior to the procedure, a History and Physical was                         performed, and patient medications and allergies were                         reviewed. The patient is competent. The risks and                         benefits of the procedure and the sedation options and                         risks were discussed with the patient. All questions                         were answered and informed consent was obtained.                         Patient identification and proposed procedure were                         verified by the physician, the anesthesiologist, the                         anesthetist and the technician in the endoscopy suite.                         Mental Status Examination: alert and oriented. Airway                         Examination: normal oropharyngeal airway and neck                         mobility. Respiratory Examination: clear to                         auscultation. CV Examination: RRR, no murmurs, no S3                         or S4. Prophylactic Antibiotics: The patient does not  require prophylactic antibiotics. Prior                         Anticoagulants: The patient has taken no anticoagulant                         or antiplatelet  agents. ASA Grade Assessment: II - A                         patient with mild systemic disease. After reviewing                         the risks and benefits, the patient was deemed in                         satisfactory condition to undergo the procedure. The                         anesthesia plan was to use monitored anesthesia care                         (MAC). Immediately prior to administration of                         medications, the patient was re-assessed for adequacy                         to receive sedatives. The heart rate, respiratory                         rate, oxygen saturations, blood pressure, adequacy of                         pulmonary ventilation, and response to care were                         monitored throughout the procedure. The physical                         status of the patient was re-assessed after the                         procedure.                        After obtaining informed consent, the colonoscope was                         passed under direct vision. Throughout the procedure,                         the patient's blood pressure, pulse, and oxygen                         saturations were monitored continuously. The                         Colonoscope was introduced through the anus and  advanced to the the cecum, identified by appendiceal                         orifice and ileocecal valve. The colonoscopy was                         performed without difficulty. The patient tolerated                         the procedure well. The quality of the bowel                         preparation was evaluated using the BBPS [facility] Bowel                         Preparation Scale) with scores of: Right Colon = 2                         (minor amount of residual staining, small fragments of                         stool and/or opaque liquid, but mucosa seen well),                         Transverse Colon = 3 (entire  mucosa seen well with no                         residual staining, small fragments of stool or opaque                         liquid) and Left Colon = 3 (entire mucosa seen well                         with no residual staining, small fragments of stool or                         opaque liquid). The total BBPS score equals 8. The                         quality of the bowel preparation was excellent. The                         terminal ileum, ileocecal valve, appendiceal orifice,                         and rectum were photographed. Findings:      The perianal and digital rectal examinations were normal. Pertinent       negatives include normal sphincter tone.      The terminal ileum appeared normal. Estimated blood loss: none.      Two sessile polyps were found in the descending colon and ascending       colon. The polyps were 1 to 2 mm in size. These polyps were removed with       a jumbo cold forceps. Resection and retrieval were complete. Estimated       blood loss was minimal.      The exam was otherwise without  abnormality on direct and retroflexion       views. Impression:            - The examined portion of the ileum was normal.                        - Two 1 to 2 mm polyps in the descending colon and in                         the ascending colon, removed with a jumbo cold                         forceps. Resected and retrieved.                        - The examination was otherwise normal on direct and                         retroflexion views. Recommendation:        - Patient has a contact number available for                         emergencies. The signs and symptoms of potential                         delayed complications were discussed with the patient.                         Return to normal activities tomorrow. Written                         discharge instructions were provided to the patient.                        - Discharge patient to home.                         - Resume previous diet.                        - Continue present medications.                        - Await pathology results.                        - Repeat colonoscopy for surveillance based on                         pathology results.                        - Return to referring physician as previously                         scheduled.                        - The findings and recommendations were discussed with  the patient. Procedure Code(s):     --- Professional ---                        [phone], Colonoscopy, flexible; with biopsy, single or                         multiple Diagnosis Code(s):     --- Professional ---                        Z12.11, Encounter for screening for malignant neoplasm                         of colon                        D12.4, Benign neoplasm of descending colon                        D12.2, Benign neoplasm of ascending colon CPT copyright 2022 [employer]. All rights reserved. The codes documented in this report are preliminary and upon coder review may  be revised to meet current compliance requirements. Attending Participation:      I personally performed the entire procedure. Volney American, DO Annamaria Helling DO, DO 08/27/2022 3:54:12 PM This report has been signed electronically. Number of Addenda: 0 Note Initiated On: 08/27/2022 3:17 PM Scope Withdrawal Time: 0 hours 16 minutes 54 seconds  Total Procedure Duration: 0 hours 23 minutes 37 seconds  Estimated Blood Loss:  Estimated blood loss was minimal.      [facility]

## 2022-08-28 ENCOUNTER — Encounter: Payer: Self-pay | Admitting: Gastroenterology

## 2022-08-29 LAB — SURGICAL PATHOLOGY

## 2022-09-03 NOTE — Anesthesia Postprocedure Evaluation (Signed)
Anesthesia Post Note  Patient: Omar Strickland  Procedure(s) Performed: COLONOSCOPY WITH PROPOFOL  Patient location during evaluation: Endoscopy Anesthesia Type: General Level of consciousness: awake and alert Pain management: pain level controlled Vital Signs Assessment: post-procedure vital signs reviewed and stable Respiratory status: spontaneous breathing, nonlabored ventilation, respiratory function stable and patient connected to nasal cannula oxygen Cardiovascular status: blood pressure returned to baseline and stable Postop Assessment: no apparent nausea or vomiting Anesthetic complications: no   No notable events documented.   Last Vitals:  Vitals:   08/27/22 1623 08/27/22 1633  BP:  (!) 126/101  Pulse:    Resp:    Temp:    SpO2: 100% 100%    Last Pain:  Vitals:   08/28/22 0727  TempSrc:   PainSc: 0-No pain                 Martha Clan

## 2022-09-05 ENCOUNTER — Other Ambulatory Visit: Payer: Self-pay

## 2022-09-05 MED ORDER — LOSARTAN POTASSIUM 25 MG PO TABS
25.0000 mg | ORAL_TABLET | Freq: Every day | ORAL | 1 refills | Status: AC
  Filled 2022-09-05: qty 90, 90d supply, fill #0
  Filled 2023-02-22: qty 90, 90d supply, fill #1

## 2022-09-06 ENCOUNTER — Other Ambulatory Visit: Payer: Self-pay

## 2022-09-06 MED ORDER — METFORMIN HCL ER 500 MG PO TB24
500.0000 mg | ORAL_TABLET | Freq: Two times a day (BID) | ORAL | 1 refills | Status: AC
  Filled 2022-09-06: qty 180, 90d supply, fill #0

## 2022-09-11 ENCOUNTER — Other Ambulatory Visit: Payer: Self-pay

## 2022-09-11 MED ORDER — METFORMIN HCL ER 500 MG PO TB24
1000.0000 mg | ORAL_TABLET | Freq: Two times a day (BID) | ORAL | 1 refills | Status: AC
  Filled 2022-11-06: qty 360, 90d supply, fill #0
  Filled 2022-11-06: qty 299, 75d supply, fill #0
  Filled 2023-02-22: qty 360, 90d supply, fill #1

## 2022-11-06 ENCOUNTER — Other Ambulatory Visit: Payer: Self-pay

## 2022-12-04 DIAGNOSIS — E119 Type 2 diabetes mellitus without complications: Secondary | ICD-10-CM | POA: Diagnosis not present

## 2022-12-04 DIAGNOSIS — E782 Mixed hyperlipidemia: Secondary | ICD-10-CM | POA: Diagnosis not present

## 2022-12-04 DIAGNOSIS — Z6835 Body mass index (BMI) 35.0-35.9, adult: Secondary | ICD-10-CM | POA: Diagnosis not present

## 2022-12-04 DIAGNOSIS — I10 Essential (primary) hypertension: Secondary | ICD-10-CM | POA: Diagnosis not present

## 2022-12-11 ENCOUNTER — Other Ambulatory Visit: Payer: Self-pay

## 2022-12-11 DIAGNOSIS — E782 Mixed hyperlipidemia: Secondary | ICD-10-CM | POA: Diagnosis not present

## 2022-12-11 DIAGNOSIS — E119 Type 2 diabetes mellitus without complications: Secondary | ICD-10-CM | POA: Diagnosis not present

## 2022-12-11 DIAGNOSIS — I10 Essential (primary) hypertension: Secondary | ICD-10-CM | POA: Diagnosis not present

## 2022-12-11 DIAGNOSIS — Z6835 Body mass index (BMI) 35.0-35.9, adult: Secondary | ICD-10-CM | POA: Diagnosis not present

## 2022-12-11 MED ORDER — GLUCOSE BLOOD VI STRP
1.0000 | ORAL_STRIP | Freq: Every day | 1 refills | Status: AC
  Filled 2022-12-11: qty 100, 90d supply, fill #0

## 2022-12-11 MED ORDER — FREESTYLE LANCETS MISC
1 refills | Status: AC
  Filled 2022-12-11: qty 100, 90d supply, fill #0

## 2022-12-11 MED ORDER — FREESTYLE FREEDOM LITE W/DEVICE KIT
PACK | 0 refills | Status: AC
  Filled 2022-12-11: qty 1, 30d supply, fill #0

## 2023-02-22 ENCOUNTER — Other Ambulatory Visit: Payer: Self-pay

## 2023-02-22 MED ORDER — LOVASTATIN 20 MG PO TABS
20.0000 mg | ORAL_TABLET | Freq: Every day | ORAL | 1 refills | Status: AC
  Filled 2023-02-22: qty 90, 90d supply, fill #0
